# Patient Record
Sex: Female | Born: 1995 | Race: White | Hispanic: No | Marital: Single | Smoking: Never smoker
Health system: Southern US, Community
[De-identification: ages and names within clinical notes are randomized; demographics above are authoritative.]

## PROBLEM LIST (undated history)

## (undated) DIAGNOSIS — F419 Anxiety disorder, unspecified: Secondary | ICD-10-CM

## (undated) DIAGNOSIS — I071 Rheumatic tricuspid insufficiency: Secondary | ICD-10-CM

## (undated) DIAGNOSIS — F1910 Other psychoactive substance abuse, uncomplicated: Secondary | ICD-10-CM

## (undated) DIAGNOSIS — F32A Depression, unspecified: Secondary | ICD-10-CM

## (undated) DIAGNOSIS — F329 Major depressive disorder, single episode, unspecified: Secondary | ICD-10-CM

---
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
## 2016-06-08 ENCOUNTER — Encounter (HOSPITAL_COMMUNITY): Payer: Self-pay | Admitting: Emergency Medicine

## 2016-06-08 ENCOUNTER — Ambulatory Visit (HOSPITAL_COMMUNITY)
Admission: EM | Admit: 2016-06-08 | Discharge: 2016-06-08 | Disposition: A | Discharge status: Home / Self Care | Payer: BLUE CROSS/BLUE SHIELD | Attending: Family Medicine | Admitting: Family Medicine

## 2016-06-08 DIAGNOSIS — J0100 Acute maxillary sinusitis, unspecified: Secondary | ICD-10-CM | POA: Diagnosis not present

## 2016-06-08 HISTORY — DX: Depression, unspecified: F32.A

## 2016-06-08 HISTORY — DX: Anxiety disorder, unspecified: F41.9

## 2016-06-08 HISTORY — DX: Major depressive disorder, single episode, unspecified: F32.9

## 2016-06-08 MED ORDER — FLUTICASONE PROPIONATE 50 MCG/ACT NA SUSP: 2.0000 | Freq: Every day | NASAL | Status: DC

## 2016-06-08 MED ORDER — AMOXICILLIN 500 MG PO CAPS: 500.0000 mg | ORAL_CAPSULE | Freq: Three times a day (TID) | ORAL | Status: DC

## 2016-06-08 NOTE — Discharge Instructions (Signed)
Sinusitis, Adult °Sinusitis is redness, soreness, and puffiness (inflammation) of the air pockets in the bones of your face (sinuses). The redness, soreness, and puffiness can cause air and mucus to get trapped in your sinuses. This can allow germs to grow and cause an infection.  °HOME CARE  °· Drink enough fluids to keep your pee (urine) clear or pale yellow. °· Use a humidifier in your home. °· Run a hot shower to create steam in the bathroom. Sit in the bathroom with the door closed. Breathe in the steam 3-4 times a day. °· Put a warm, moist washcloth on your face 3-4 times a day, or as told by your doctor. °· Use salt water sprays (saline sprays) to wet the thick fluid in your nose. This can help the sinuses drain. °· Only take medicine as told by your doctor. °GET HELP RIGHT AWAY IF:  °· Your pain gets worse. °· You have very bad headaches. °· You are sick to your stomach (nauseous). °· You throw up (vomit). °· You are very sleepy (drowsy) all the time. °· Your face is puffy (swollen). °· Your vision changes. °· You have a stiff neck. °· You have trouble breathing. °MAKE SURE YOU:  °· Understand these instructions. °· Will watch your condition. °· Will get help right away if you are not doing well or get worse. °  °This information is not intended to replace advice given to you by your health care provider. Make sure you discuss any questions you have with your health care provider. °  °Document Released: 06/01/2008 Document Revised: 01/04/2015 Document Reviewed: 07/19/2012 °Elsevier Interactive Patient Education ©2016 [employer] ° °Sinus Rinse °WHAT IS A SINUS RINSE? °A sinus rinse is a home treatment. It rinses your sinuses with a mixture of salt and water (saline solution). Sinuses are air-filled spaces in your skull behind the bones of your face and forehead. They open into your nasal cavity. °To do a sinus rinse, you will need: °· Saline solution. °· Neti pot or spray bottle. This releases the saline  solution into your nose and through your sinuses. You can buy neti pots and spray bottles at: °¨ Your local pharmacy. °¨ A health food store. °¨ Online. °WHEN WOULD I DO A SINUS RINSE?  °A sinus rinse can help to clear your nasal cavity. It can clear:  °· Mucus. °· Dirt. °· Dust. °· Pollen. °You may do a sinus rinse when you have: °· A cold. °· A virus. °· Allergies. °· A sinus infection. °· A stuffy nose. °If you are considering a sinus rinse: °· Ask your child's doctor before doing a sinus rinse on your child. °· Do not do a sinus rinse if you have had: °¨ Ear or nasal surgery. °¨ An ear infection. °¨ Blocked ears. °HOW DO I DO A SINUS RINSE?  °· Wash your hands. °· Disinfect your device using the directions that came with the device. °· Dry your device. °· Use the solution that comes with your device or one that is sold separately in stores. Follow the mixing directions on the package. °· Fill your device with the amount of saline solution as stated in the device instructions. °· Stand over a sink and tilt your head sideways over the sink. °· Place the spout of the device in your upper nostril (the one closer to the ceiling). °· Gently pour or squeeze the saline solution into the nasal cavity. The liquid should drain to the lower nostril if you   are not too congested. °· Gently blow your nose. Blowing too hard may cause ear pain. °· Repeat in the other nostril. °· Clean and rinse your device with clean water. °· Air-dry your device. °ARE THERE RISKS OF A SINUS RINSE?  °Sinus rinse is normally very safe and helpful. However, there are a few risks, which include:  °· A burning feeling in the sinuses. This may happen if you do not make the saline solution as instructed. Make sure to follow all directions when making the saline solution. °· Infection from unclean water. This is rare, but possible. °· Nasal irritation. °  °This information is not intended to replace advice given to you by your health care provider.  Make sure you discuss any questions you have with your health care provider. °  °Document Released: 07/11/2014 Document Reviewed: 07/11/2014 °Elsevier Interactive Patient Education ©2016 Elsevier Inc. ° °

## 2016-06-08 NOTE — ED Provider Notes (Signed)
CSN: [id number]     Arrival date & time 06/08/16  1450 History   First MD Initiated Contact with Patient 06/08/16 1620     Chief Complaint  Patient presents with  . URI   (Consider location/radiation/quality/duration/timing/severity/associated sxs/prior Treatment) HPI History obtained from patient: Location: Upper resp  Context/Duration: Since last Thursday  Severity: 5  Quality:ache, pressure,  Timing:           constant Home Treatment: OTC meds without relief Associated symptoms:  Post nasal drainage.  Family History: HTN, DM-grandparents.    Past Medical History  Diagnosis Date  . Anxiety   . Depression    History reviewed. No pertinent past surgical history. Family History  Problem Relation Age of Onset  . Hypertension Paternal Grandfather   . Asthma Paternal Grandfather   . Diabetes Maternal Grandmother    Social History  Substance Use Topics  . Smoking status: Never Smoker   . Smokeless tobacco: None  . Alcohol Use: No   OB History    No data available     Review of Systems  Denies: HEADACHE, NAUSEA, ABDOMINAL PAIN, CHEST PAIN,   DYSURIA, SHORTNESS OF BREATH  Allergies  Review of patient's allergies indicates no known allergies.  Home Medications   Prior to Admission medications   Medication Sig Start Date End Date Taking? Authorizing Provider  METHADONE HCL PO Take by mouth.   Yes Historical Provider, MD  amoxicillin (AMOXIL) 500 MG capsule Take 1 capsule (500 mg total) by mouth 3 (three) times daily. 06/08/16   Tharon AquasFrank C Maleko Greulich, PA  Aspirin-Acetaminophen [phone] MG PACK Take 1 each by mouth as needed (pain).    Historical Provider, MD  dicyclomine (BENTYL) 20 MG tablet Take 1 tablet (20 mg total) by mouth 2 (two) times daily. 03/29/15   Oswaldo ConroyVictoria Creech, PA-C  fluticasone (FLONASE) 50 MCG/ACT nasal spray Place 2 sprays into both nostrils daily. 06/08/16   Tharon AquasFrank C Fritzi Scripter, PA   Meds Ordered and Administered this Visit  Medications - No data to display  BP  105/56 mmHg  Pulse 113  Temp(Src) 100 F (37.8 C) (Oral)  Resp 22  SpO2 95%  LMP 05/08/2016 No data found.   Physical Exam NURSES NOTES AND VITAL SIGNS REVIEWED. CONSTITUTIONAL: Well developed, well nourished, no acute distress HEENT: normocephalic, atraumatic, maxillary sinus on left, more tender than right.  EYES: Conjunctiva normal NECK:normal ROM, supple, no adenopathy PULMONARY:No respiratory distress, normal effort ABDOMINAL: Soft, ND, NT BS+, No CVAT MUSCULOSKELETAL: Normal ROM of all extremities,  SKIN: warm and dry without rash PSYCHIATRIC: Mood and affect, behavior are normal  ED Course  Procedures (including critical care time)  Labs Review Labs Reviewed - No data to display  Imaging Review No results found.   Visual Acuity Review  Right Eye Distance:   Left Eye Distance:   Bilateral Distance:    Right Eye Near:   Left Eye Near:    Bilateral Near:      RX amoxil, flonase  Expect full recovery.  MDM   1. Acute maxillary sinusitis, recurrence not specified     Patient is reassured that there are no issues that require transfer to higher level of care at this time or additional tests. Patient is advised to continue home symptomatic treatment. Patient is advised that if there are new or worsening symptoms to attend the emergency department, contact primary care provider, or return to UC. Instructions of care provided discharged home in stable condition.    THIS NOTE WAS  GENERATED USING A VOICE RECOGNITION SOFTWARE PROGRAM. ALL REASONABLE EFFORTS  WERE MADE TO PROOFREAD THIS DOCUMENT FOR ACCURACY.  I have verbally reviewed the discharge instructions with the patient. A printed AVS was given to the patient.  All questions were answered prior to discharge.      Tharon Aquas, PA 06/08/16 1646

## 2016-06-08 NOTE — ED Notes (Signed)
Patient reports getting sick on Thursday.  Woke with a headache and has pressure behind ears and nose and eyes.  Started coughing today, productive cough. Denies runny nose.  Patient unsure if running a fever.

## 2022-02-05 ENCOUNTER — Emergency Department (HOSPITAL_BASED_OUTPATIENT_CLINIC_OR_DEPARTMENT_OTHER): Payer: Medicaid Other | Admitting: Radiology

## 2022-02-05 ENCOUNTER — Inpatient Hospital Stay (HOSPITAL_BASED_OUTPATIENT_CLINIC_OR_DEPARTMENT_OTHER)
Admission: EM | Admit: 2022-02-05 | Discharge: 2022-02-09 | DRG: 872 | Disposition: A | Discharge status: Home / Self Care | Payer: Medicaid Other | Attending: Internal Medicine | Admitting: Internal Medicine

## 2022-02-05 ENCOUNTER — Other Ambulatory Visit: Payer: Self-pay

## 2022-02-05 ENCOUNTER — Encounter (HOSPITAL_BASED_OUTPATIENT_CLINIC_OR_DEPARTMENT_OTHER): Payer: Self-pay

## 2022-02-05 DIAGNOSIS — Z20822 Contact with and (suspected) exposure to covid-19: Secondary | ICD-10-CM | POA: Diagnosis present

## 2022-02-05 DIAGNOSIS — B955 Unspecified streptococcus as the cause of diseases classified elsewhere: Secondary | ICD-10-CM | POA: Diagnosis present

## 2022-02-05 DIAGNOSIS — F1990 Other psychoactive substance use, unspecified, uncomplicated: Secondary | ICD-10-CM

## 2022-02-05 DIAGNOSIS — A409 Streptococcal sepsis, unspecified: Principal | ICD-10-CM | POA: Diagnosis present

## 2022-02-05 DIAGNOSIS — A419 Sepsis, unspecified organism: Secondary | ICD-10-CM | POA: Diagnosis present

## 2022-02-05 DIAGNOSIS — Z7982 Long term (current) use of aspirin: Secondary | ICD-10-CM

## 2022-02-05 DIAGNOSIS — L0390 Cellulitis, unspecified: Secondary | ICD-10-CM | POA: Diagnosis present

## 2022-02-05 DIAGNOSIS — R079 Chest pain, unspecified: Secondary | ICD-10-CM | POA: Diagnosis present

## 2022-02-05 DIAGNOSIS — R21 Rash and other nonspecific skin eruption: Secondary | ICD-10-CM | POA: Diagnosis present

## 2022-02-05 DIAGNOSIS — O99320 Drug use complicating pregnancy, unspecified trimester: Secondary | ICD-10-CM | POA: Diagnosis present

## 2022-02-05 DIAGNOSIS — B182 Chronic viral hepatitis C: Secondary | ICD-10-CM | POA: Diagnosis present

## 2022-02-05 DIAGNOSIS — F1910 Other psychoactive substance abuse, uncomplicated: Secondary | ICD-10-CM | POA: Diagnosis present

## 2022-02-05 DIAGNOSIS — L03113 Cellulitis of right upper limb: Secondary | ICD-10-CM | POA: Diagnosis present

## 2022-02-05 DIAGNOSIS — I071 Rheumatic tricuspid insufficiency: Secondary | ICD-10-CM | POA: Diagnosis present

## 2022-02-05 DIAGNOSIS — F1123 Opioid dependence with withdrawal: Secondary | ICD-10-CM | POA: Diagnosis present

## 2022-02-05 DIAGNOSIS — Z79899 Other long term (current) drug therapy: Secondary | ICD-10-CM

## 2022-02-05 DIAGNOSIS — R011 Cardiac murmur, unspecified: Secondary | ICD-10-CM

## 2022-02-05 DIAGNOSIS — R7881 Bacteremia: Secondary | ICD-10-CM | POA: Diagnosis present

## 2022-02-05 DIAGNOSIS — F32A Depression, unspecified: Secondary | ICD-10-CM | POA: Diagnosis present

## 2022-02-05 DIAGNOSIS — F419 Anxiety disorder, unspecified: Secondary | ICD-10-CM | POA: Diagnosis present

## 2022-02-05 DIAGNOSIS — M549 Dorsalgia, unspecified: Secondary | ICD-10-CM | POA: Diagnosis present

## 2022-02-05 HISTORY — DX: Other psychoactive substance abuse, uncomplicated: F19.10

## 2022-02-05 HISTORY — DX: Rheumatic tricuspid insufficiency: I07.1

## 2022-02-05 LAB — COMPREHENSIVE METABOLIC PANEL
ALT: 102 U/L — ABNORMAL HIGH (ref 0–44)
AST: 56 U/L — ABNORMAL HIGH (ref 15–41)
Albumin: 4.2 g/dL (ref 3.5–5.0)
Alkaline Phosphatase: 58 U/L (ref 38–126)
Anion gap: 9 (ref 5–15)
BUN: 12 mg/dL (ref 6–20)
CO2: 29 mmol/L (ref 22–32)
Calcium: 9.2 mg/dL (ref 8.9–10.3)
Chloride: 98 mmol/L (ref 98–111)
Creatinine, Ser: 0.7 mg/dL (ref 0.44–1.00)
GFR, Estimated: >60 mL/min (ref >60)
Glucose, Bld: 108 mg/dL — ABNORMAL HIGH (ref 70–99)
Potassium: 3.7 mmol/L (ref 3.5–5.1)
Sodium: 136 mmol/L (ref 135–145)
Total Bilirubin: 0.6 mg/dL (ref 0.3–1.2)
Total Protein: 7.6 g/dL (ref 6.5–8.1)

## 2022-02-05 LAB — CBC WITH DIFFERENTIAL/PLATELET
Abs Immature Granulocytes: 0.03 10*3/uL (ref 0.00–0.07)
Basophils Absolute: 0 10*3/uL (ref 0.0–0.1)
Basophils Relative: 0 %
Eosinophils Absolute: 0 10*3/uL (ref 0.0–0.5)
Eosinophils Relative: 0 %
HCT: 40.5 % (ref 36.0–46.0)
Hemoglobin: 14.3 g/dL (ref 12.0–15.0)
Immature Granulocytes: 1 %
Lymphocytes Relative: 23 %
Lymphs Abs: 1.4 10*3/uL (ref 0.7–4.0)
MCH: 31 pg (ref 26.0–34.0)
MCHC: 35.3 g/dL (ref 30.0–36.0)
MCV: 87.7 fL (ref 80.0–100.0)
Monocytes Absolute: 0.6 10*3/uL (ref 0.1–1.0)
Monocytes Relative: 9 %
Neutro Abs: 4.2 10*3/uL (ref 1.7–7.7)
Neutrophils Relative %: 67 %
Platelets: 149 10*3/uL — ABNORMAL LOW (ref 150–400)
RBC: 4.62 MIL/uL (ref 3.87–5.11)
RDW: 12 % (ref 11.5–15.5)
WBC: 6.2 10*3/uL (ref 4.0–10.5)
nRBC: 0 % (ref 0.0–0.2)

## 2022-02-05 LAB — PROTIME-INR
INR: 1.1 (ref 0.8–1.2)
Prothrombin Time: 14.7 seconds (ref 11.4–15.2)

## 2022-02-05 LAB — APTT: aPTT: 32 seconds (ref 24–36)

## 2022-02-05 MED ORDER — LACTATED RINGERS IV BOLUS (SEPSIS)
250.0000 mL | Freq: Once | INTRAVENOUS | Status: AC
  Administered 2022-02-06: 250 mL via INTRAVENOUS

## 2022-02-05 MED ORDER — METRONIDAZOLE 500 MG/100ML IV SOLN
500.0000 mg | Freq: Once | INTRAVENOUS | Status: AC
  Administered 2022-02-05: 500 mg via INTRAVENOUS
  Filled 2022-02-05: qty 100

## 2022-02-05 MED ORDER — LACTATED RINGERS IV BOLUS (SEPSIS)
1000.0000 mL | Freq: Once | INTRAVENOUS | Status: AC
  Administered 2022-02-05: 1000 mL via INTRAVENOUS

## 2022-02-05 MED ORDER — LACTATED RINGERS IV SOLN: INTRAVENOUS | Status: DC

## 2022-02-05 MED ORDER — SODIUM CHLORIDE 0.9 % IV SOLN
2.0000 g | Freq: Once | INTRAVENOUS | Status: AC
  Administered 2022-02-05: 2 g via INTRAVENOUS
  Filled 2022-02-05: qty 2

## 2022-02-05 MED ORDER — ACETAMINOPHEN 500 MG PO TABS
1000.0000 mg | ORAL_TABLET | Freq: Once | ORAL | Status: AC
  Administered 2022-02-05: 1000 mg via ORAL
  Filled 2022-02-05: qty 2

## 2022-02-05 MED ORDER — VANCOMYCIN HCL IN DEXTROSE 1-5 GM/200ML-% IV SOLN
1000.0000 mg | Freq: Once | INTRAVENOUS | Status: AC
  Administered 2022-02-05: 1000 mg via INTRAVENOUS
  Filled 2022-02-05: qty 200

## 2022-02-05 MED ORDER — LACTATED RINGERS IV BOLUS (SEPSIS)
500.0000 mL | Freq: Once | INTRAVENOUS | Status: AC
  Administered 2022-02-06: 500 mL via INTRAVENOUS

## 2022-02-05 NOTE — ED Triage Notes (Signed)
Pt arrives POV with c/o central chest pain, back pain, leg pain that has been getting worse.  Pt is an active heroin and meth user.  Used heroin last yesterday.  Right hand and forearm are red and swollen, pt states she missed when shooting up a couple of days ago.  Has history of sepsis.

## 2022-02-05 NOTE — Sepsis Progress Note (Signed)
Monitoring for the code sepsis protocol. °

## 2022-02-06 ENCOUNTER — Encounter (HOSPITAL_BASED_OUTPATIENT_CLINIC_OR_DEPARTMENT_OTHER): Payer: Self-pay | Admitting: Emergency Medicine

## 2022-02-06 DIAGNOSIS — Z20822 Contact with and (suspected) exposure to covid-19: Secondary | ICD-10-CM | POA: Diagnosis present

## 2022-02-06 DIAGNOSIS — A409 Streptococcal sepsis, unspecified: Secondary | ICD-10-CM | POA: Diagnosis present

## 2022-02-06 DIAGNOSIS — L0390 Cellulitis, unspecified: Secondary | ICD-10-CM | POA: Diagnosis not present

## 2022-02-06 DIAGNOSIS — A419 Sepsis, unspecified organism: Secondary | ICD-10-CM | POA: Diagnosis present

## 2022-02-06 DIAGNOSIS — Z79899 Other long term (current) drug therapy: Secondary | ICD-10-CM | POA: Diagnosis not present

## 2022-02-06 DIAGNOSIS — F1910 Other psychoactive substance abuse, uncomplicated: Secondary | ICD-10-CM | POA: Diagnosis present

## 2022-02-06 DIAGNOSIS — O99320 Drug use complicating pregnancy, unspecified trimester: Secondary | ICD-10-CM | POA: Diagnosis not present

## 2022-02-06 DIAGNOSIS — R21 Rash and other nonspecific skin eruption: Secondary | ICD-10-CM | POA: Diagnosis present

## 2022-02-06 DIAGNOSIS — F32A Depression, unspecified: Secondary | ICD-10-CM | POA: Diagnosis present

## 2022-02-06 DIAGNOSIS — M549 Dorsalgia, unspecified: Secondary | ICD-10-CM | POA: Diagnosis present

## 2022-02-06 DIAGNOSIS — Z7982 Long term (current) use of aspirin: Secondary | ICD-10-CM | POA: Diagnosis not present

## 2022-02-06 DIAGNOSIS — B182 Chronic viral hepatitis C: Secondary | ICD-10-CM | POA: Diagnosis present

## 2022-02-06 DIAGNOSIS — R7881 Bacteremia: Secondary | ICD-10-CM | POA: Diagnosis not present

## 2022-02-06 DIAGNOSIS — R079 Chest pain, unspecified: Secondary | ICD-10-CM | POA: Diagnosis present

## 2022-02-06 DIAGNOSIS — F419 Anxiety disorder, unspecified: Secondary | ICD-10-CM | POA: Diagnosis present

## 2022-02-06 DIAGNOSIS — F1990 Other psychoactive substance use, unspecified, uncomplicated: Secondary | ICD-10-CM | POA: Diagnosis not present

## 2022-02-06 DIAGNOSIS — I071 Rheumatic tricuspid insufficiency: Secondary | ICD-10-CM | POA: Diagnosis present

## 2022-02-06 DIAGNOSIS — B955 Unspecified streptococcus as the cause of diseases classified elsewhere: Secondary | ICD-10-CM | POA: Diagnosis not present

## 2022-02-06 DIAGNOSIS — L03113 Cellulitis of right upper limb: Secondary | ICD-10-CM | POA: Diagnosis present

## 2022-02-06 DIAGNOSIS — F1123 Opioid dependence with withdrawal: Secondary | ICD-10-CM | POA: Diagnosis present

## 2022-02-06 DIAGNOSIS — R011 Cardiac murmur, unspecified: Secondary | ICD-10-CM | POA: Diagnosis present

## 2022-02-06 LAB — BLOOD CULTURE ID PANEL (REFLEXED) - BCID2
A.calcoaceticus-baumannii: NOT DETECTED
A.calcoaceticus-baumannii: NOT DETECTED
Bacteroides fragilis: NOT DETECTED
Bacteroides fragilis: NOT DETECTED
Candida albicans: NOT DETECTED
Candida albicans: NOT DETECTED
Candida auris: NOT DETECTED
Candida auris: NOT DETECTED
Candida glabrata: NOT DETECTED
Candida glabrata: NOT DETECTED
Candida krusei: NOT DETECTED
Candida krusei: NOT DETECTED
Candida parapsilosis: NOT DETECTED
Candida parapsilosis: NOT DETECTED
Candida tropicalis: NOT DETECTED
Candida tropicalis: NOT DETECTED
Cryptococcus neoformans/gattii: NOT DETECTED
Cryptococcus neoformans/gattii: NOT DETECTED
Enterobacter cloacae complex: NOT DETECTED
Enterobacter cloacae complex: NOT DETECTED
Enterobacterales: NOT DETECTED
Enterobacterales: NOT DETECTED
Enterococcus Faecium: NOT DETECTED
Enterococcus Faecium: NOT DETECTED
Enterococcus faecalis: NOT DETECTED
Enterococcus faecalis: NOT DETECTED
Escherichia coli: NOT DETECTED
Escherichia coli: NOT DETECTED
Haemophilus influenzae: NOT DETECTED
Haemophilus influenzae: NOT DETECTED
Klebsiella aerogenes: NOT DETECTED
Klebsiella aerogenes: NOT DETECTED
Klebsiella oxytoca: NOT DETECTED
Klebsiella oxytoca: NOT DETECTED
Klebsiella pneumoniae: NOT DETECTED
Klebsiella pneumoniae: NOT DETECTED
Listeria monocytogenes: NOT DETECTED
Listeria monocytogenes: NOT DETECTED
Neisseria meningitidis: NOT DETECTED
Neisseria meningitidis: NOT DETECTED
Proteus species: NOT DETECTED
Proteus species: NOT DETECTED
Pseudomonas aeruginosa: NOT DETECTED
Pseudomonas aeruginosa: NOT DETECTED
Salmonella species: NOT DETECTED
Salmonella species: NOT DETECTED
Serratia marcescens: NOT DETECTED
Serratia marcescens: NOT DETECTED
Staphylococcus aureus (BCID): NOT DETECTED
Staphylococcus aureus (BCID): NOT DETECTED
Staphylococcus epidermidis: NOT DETECTED
Staphylococcus epidermidis: NOT DETECTED
Staphylococcus lugdunensis: NOT DETECTED
Staphylococcus lugdunensis: NOT DETECTED
Staphylococcus species: NOT DETECTED
Staphylococcus species: NOT DETECTED
Stenotrophomonas maltophilia: NOT DETECTED
Stenotrophomonas maltophilia: NOT DETECTED
Streptococcus agalactiae: NOT DETECTED
Streptococcus agalactiae: NOT DETECTED
Streptococcus pneumoniae: NOT DETECTED
Streptococcus pneumoniae: NOT DETECTED
Streptococcus pyogenes: DETECTED — AB
Streptococcus pyogenes: DETECTED — AB
Streptococcus species: DETECTED — AB
Streptococcus species: DETECTED — AB

## 2022-02-06 LAB — URINALYSIS, ROUTINE W REFLEX MICROSCOPIC
Bilirubin Urine: NEGATIVE
Glucose, UA: NEGATIVE mg/dL
Hgb urine dipstick: NEGATIVE
Ketones, ur: 15 mg/dL — AB
Nitrite: NEGATIVE
Protein, ur: 30 mg/dL — AB
Specific Gravity, Urine: 1.033 — ABNORMAL HIGH (ref 1.005–1.030)
pH: 6.5 (ref 5.0–8.0)

## 2022-02-06 LAB — PREGNANCY, URINE: Preg Test, Ur: NEGATIVE

## 2022-02-06 LAB — RAPID URINE DRUG SCREEN, HOSP PERFORMED
Amphetamines: POSITIVE — AB
Barbiturates: NOT DETECTED
Benzodiazepines: NOT DETECTED
Cocaine: NOT DETECTED
Opiates: NOT DETECTED
Tetrahydrocannabinol: NOT DETECTED

## 2022-02-06 LAB — RESP PANEL BY RT-PCR (FLU A&B, COVID) ARPGX2
Influenza A by PCR: NEGATIVE
Influenza B by PCR: NEGATIVE
SARS Coronavirus 2 by RT PCR: NEGATIVE

## 2022-02-06 LAB — LACTIC ACID, PLASMA
Lactic Acid, Venous: 2.6 mmol/L (ref 0.5–1.9)
Lactic Acid, Venous: 1.1 mmol/L (ref 0.5–1.9)

## 2022-02-06 MED ORDER — SODIUM CHLORIDE 0.9 % IV SOLN
2.0000 g | Freq: Three times a day (TID) | INTRAVENOUS | Status: DC
  Administered 2022-02-06 (×2): 2 g via INTRAVENOUS
  Filled 2022-02-06 (×4): qty 2

## 2022-02-06 MED ORDER — VANCOMYCIN HCL 500 MG/100ML IV SOLN
500.0000 mg | Freq: Three times a day (TID) | INTRAVENOUS | Status: DC
  Filled 2022-02-06 (×3): qty 100

## 2022-02-06 MED ORDER — SODIUM CHLORIDE 0.9% FLUSH
3.0000 mL | Freq: Two times a day (BID) | INTRAVENOUS | Status: DC
  Administered 2022-02-06 – 2022-02-07 (×3): 3 mL via INTRAVENOUS

## 2022-02-06 MED ORDER — ONDANSETRON HCL 4 MG/2ML IJ SOLN
4.0000 mg | Freq: Once | INTRAMUSCULAR | Status: AC
Start: 2022-02-06 — End: 2022-02-06

## 2022-02-06 MED ORDER — PENICILLIN G POTASSIUM 20000000 UNITS IJ SOLR
12.0000 10*6.[IU] | Freq: Two times a day (BID) | INTRAVENOUS | Status: DC
  Administered 2022-02-06 – 2022-02-09 (×6): 12 10*6.[IU] via INTRAVENOUS
  Filled 2022-02-06 (×8): qty 12

## 2022-02-06 MED ORDER — VANCOMYCIN HCL 750 MG/150ML IV SOLN
750.0000 mg | Freq: Two times a day (BID) | INTRAVENOUS | Status: DC
  Filled 2022-02-06: qty 150

## 2022-02-06 MED ORDER — VANCOMYCIN HCL 500 MG IV SOLR
500.0000 mg | Freq: Once | INTRAVENOUS | Status: AC
  Administered 2022-02-06: 500 mg via INTRAVENOUS
  Filled 2022-02-06: qty 10

## 2022-02-06 MED ORDER — ONDANSETRON HCL 4 MG/2ML IJ SOLN
INTRAMUSCULAR | Status: AC
  Administered 2022-02-06: 4 mg via INTRAVENOUS
  Filled 2022-02-06: qty 2

## 2022-02-06 MED ORDER — SODIUM CHLORIDE 0.9 % IV SOLN: 2.0000 g | INTRAVENOUS | Status: DC

## 2022-02-06 MED ORDER — SODIUM CHLORIDE 0.9% FLUSH: 3.0000 mL | INTRAVENOUS | Status: DC | PRN

## 2022-02-06 MED ORDER — KETOROLAC TROMETHAMINE 30 MG/ML IJ SOLN
15.0000 mg | Freq: Once | INTRAMUSCULAR | Status: AC
  Administered 2022-02-06: 15 mg via INTRAVENOUS
  Filled 2022-02-06: qty 1

## 2022-02-06 MED ORDER — ACETAMINOPHEN 500 MG PO TABS
1000.0000 mg | ORAL_TABLET | Freq: Once | ORAL | Status: AC
  Administered 2022-02-06: 1000 mg via ORAL
  Filled 2022-02-06: qty 2

## 2022-02-06 MED ORDER — SODIUM CHLORIDE 0.9 % IV SOLN: 250.0000 mL | INTRAVENOUS | Status: DC | PRN

## 2022-02-06 MED ORDER — LACTATED RINGERS IV BOLUS
500.0000 mL | Freq: Once | INTRAVENOUS | Status: AC
Start: 2022-02-06 — End: 2022-02-06
  Administered 2022-02-06: 500 mL via INTRAVENOUS

## 2022-02-06 MED ORDER — VANCOMYCIN HCL 500 MG IV SOLR
INTRAVENOUS | Status: AC
  Filled 2022-02-06: qty 10

## 2022-02-06 MED ORDER — SODIUM CHLORIDE 0.9 % IV BOLUS
500.0000 mL | Freq: Once | INTRAVENOUS | Status: AC
Start: 2022-02-06 — End: 2022-02-06
  Administered 2022-02-06: 500 mL via INTRAVENOUS

## 2022-02-06 NOTE — Progress Notes (Signed)
PHARMACY - PHYSICIAN COMMUNICATION CRITICAL VALUE ALERT - BLOOD CULTURE IDENTIFICATION (BCID)  Belinda Wilson is an 26 y.o. female who presented to [facility] on 02/05/2022 with a chief complaint of RUE cellulitis. Known Hx IVDU.  Assessment: 1/4 BCx bottles growing GAS (include suspected source if known)  Name of physician (or Provider) Contacted: [place]  Current antibiotics: Vanc, cefepime  Changes to prescribed antibiotics recommended: narrowed to PCN continuous infusion per BCID protocol Recommendations accepted by provider  Results for orders placed or performed during the hospital encounter of 02/05/22  Blood Culture ID Panel (Reflexed) (Collected: 02/05/2022 11:20 PM)  Result Value Ref Range   Enterococcus faecalis NOT DETECTED NOT DETECTED   Enterococcus Faecium NOT DETECTED NOT DETECTED   Listeria monocytogenes NOT DETECTED NOT DETECTED   Staphylococcus species NOT DETECTED NOT DETECTED   Staphylococcus aureus (BCID) NOT DETECTED NOT DETECTED   Staphylococcus epidermidis NOT DETECTED NOT DETECTED   Staphylococcus lugdunensis NOT DETECTED NOT DETECTED   Streptococcus species DETECTED (A) NOT DETECTED   Streptococcus agalactiae NOT DETECTED NOT DETECTED   Streptococcus pneumoniae NOT DETECTED NOT DETECTED   Streptococcus pyogenes DETECTED (A) NOT DETECTED   A.calcoaceticus-baumannii NOT DETECTED NOT DETECTED   Bacteroides fragilis NOT DETECTED NOT DETECTED   Enterobacterales NOT DETECTED NOT DETECTED   Enterobacter cloacae complex NOT DETECTED NOT DETECTED   Escherichia coli NOT DETECTED NOT DETECTED   Klebsiella aerogenes NOT DETECTED NOT DETECTED   Klebsiella oxytoca NOT DETECTED NOT DETECTED   Klebsiella pneumoniae NOT DETECTED NOT DETECTED   Proteus species NOT DETECTED NOT DETECTED   Salmonella species NOT DETECTED NOT DETECTED   Serratia marcescens NOT DETECTED NOT DETECTED   Haemophilus influenzae NOT DETECTED NOT DETECTED   Neisseria meningitidis NOT DETECTED NOT  DETECTED   Pseudomonas aeruginosa NOT DETECTED NOT DETECTED   Stenotrophomonas maltophilia NOT DETECTED NOT DETECTED   Candida albicans NOT DETECTED NOT DETECTED   Candida auris NOT DETECTED NOT DETECTED   Candida glabrata NOT DETECTED NOT DETECTED   Candida krusei NOT DETECTED NOT DETECTED   Candida parapsilosis NOT DETECTED NOT DETECTED   Candida tropicalis NOT DETECTED NOT DETECTED   Cryptococcus neoformans/gattii NOT DETECTED NOT DETECTED    Donavon Kimrey A 02/06/2022  8:07 PM

## 2022-02-06 NOTE — Progress Notes (Signed)
Pharmacy Antibiotic Note  Belinda Wilson is a 26 y.o. female admitted on 02/05/2022 with sepsis. Pharmacy has been consulted for Vancomycin/Cefepime dosing. Pt has right hand and forearm swelling at site of IV drug use. WBC WNL. Renal function good.   Plan: Change vancomycin to 500mg  IV Q8H based on drug availability at [facility] F/u renal fxn, C&S, clinical status and peak/trough at [facility]   Height: 5\' 4"  (162.6 cm) Weight: 52.2 kg (115 lb) IBW/kg (Calculated) : 54.7  Temp (24hrs), Avg:101.1 F (38.4 C), Min:99.5 F (37.5 C), Max:102.2 F (39 C)  Recent Labs  Lab 02/05/22 2253 02/06/22 0134  WBC 6.2  --   CREATININE 0.70  --   LATICACIDVEN 2.6* 1.1     Estimated Creatinine Clearance: 89.4 mL/min (by C-G formula based on SCr of 0.7 mg/dL).    No Known Allergies  2254, PharmD, BCPS Clinical Pharmacist Please see AMION for all pharmacy numbers 02/06/2022 10:33 AM

## 2022-02-06 NOTE — ED Provider Notes (Signed)
[facility] EMERGENCY DEPT Provider Note   CSN: [id number] Arrival date & time: 02/05/22  2200     History  Chief Complaint  Patient presents with   Chest Pain    Belinda Wilson is a 26 y.o. female.  The history is provided by the patient.  Chest Pain Pain location:  Substernal area Pain quality: sharp   Pain radiates to:  Does not radiate Pain severity:  Moderate Onset quality:  Gradual Duration: days. Timing:  Constant Progression:  Unchanged Chronicity:  Recurrent Context: at rest   Relieved by:  Nothing Worsened by:  Nothing Ineffective treatments:  None tried Associated symptoms: back pain and fever   Associated symptoms: no cough, no palpitations, no shortness of breath and no vomiting   Associated symptoms comment:  B arm pain secondary to cellulitis from injections of heroin R>>>L Risk factors: no birth control, no coronary artery disease, not female and not obese       Home Medications Prior to Admission medications   Medication Sig Start Date End Date Taking? Authorizing Provider  amoxicillin (AMOXIL) 500 MG capsule Take 1 capsule (500 mg total) by mouth 3 (three) times daily. 06/08/16   Tharon Aquas, PA  Aspirin-Acetaminophen [phone] MG PACK Take 1 each by mouth as needed (pain).    [provider]  dicyclomine (BENTYL) 20 MG tablet Take 1 tablet (20 mg total) by mouth 2 (two) times daily. 03/29/15   Oswaldo Conroy, PA-C  fluticasone (FLONASE) 50 MCG/ACT nasal spray Place 2 sprays into both nostrils daily. 06/08/16   Tharon Aquas, PA  METHADONE HCL PO Take by mouth.    [provider]      Allergies    Patient has no known allergies.    Review of Systems   Review of Systems  Constitutional:  Positive for fever.  HENT:  Negative for congestion and drooling.   Eyes:  Negative for redness.  Respiratory:  Negative for cough and shortness of breath.   Cardiovascular:  Positive for chest pain. Negative for  palpitations.  Gastrointestinal:  Negative for diarrhea and vomiting.  Genitourinary:  Negative for difficulty urinating.  Musculoskeletal:  Positive for back pain.  Skin:  Positive for color change.  Neurological:  Negative for facial asymmetry.  Psychiatric/Behavioral:  Negative for agitation.   All other systems reviewed and are negative.  Physical Exam Updated Vital Signs BP (!) 94/54    Pulse 97    Temp (!) 101 F (38.3 C) (Oral)    Resp (!) 22    Ht 5\' 4"  (1.626 m)    Wt 52.2 kg    SpO2 96%    BMI 19.74 kg/m  Physical Exam Vitals and nursing note reviewed. Exam conducted with a chaperone present.  Constitutional:      Appearance: Normal appearance. She is ill-appearing.  HENT:     Head: Normocephalic and atraumatic.     Nose: Nose normal.  Eyes:     Conjunctiva/sclera: Conjunctivae normal.     Pupils: Pupils are equal, round, and reactive to light.  Cardiovascular:     Rate and Rhythm: Regular rhythm. Tachycardia present.     Pulses: Normal pulses.     Heart sounds: Murmur heard.  Pulmonary:     Effort: Pulmonary effort is normal. No respiratory distress.     Breath sounds: Normal breath sounds. No wheezing or rales.  Abdominal:     General: Bowel sounds are normal.     Palpations: Abdomen is soft.  Tenderness: There is no abdominal tenderness. There is no guarding.  Musculoskeletal:     Cervical back: Normal range of motion and neck supple.  Lymphadenopathy:     Cervical: No cervical adenopathy.  Skin:    General: Skin is warm and dry.     Capillary Refill: Capillary refill takes less than 2 seconds.     Findings: Erythema present.       Neurological:     General: No focal deficit present.     Mental Status: She is alert and oriented to person, place, and time.     Deep Tendon Reflexes: Reflexes normal.  Psychiatric:        Mood and Affect: Mood normal.        Behavior: Behavior normal.    ED Results / Procedures / Treatments   Labs (all labs ordered  are listed, but only abnormal results are displayed) Results for orders placed or performed during the hospital encounter of 02/05/22  Resp Panel by RT-PCR (Flu A&B, Covid) Nasopharyngeal Swab   Specimen: Nasopharyngeal Swab; Nasopharyngeal(NP) swabs in vial transport medium  Result Value Ref Range   SARS Coronavirus 2 by RT PCR NEGATIVE NEGATIVE   Influenza A by PCR NEGATIVE NEGATIVE   Influenza B by PCR NEGATIVE NEGATIVE  Comprehensive metabolic panel  Result Value Ref Range   Sodium 136 135 - 145 mmol/L   Potassium 3.7 3.5 - 5.1 mmol/L   Chloride 98 98 - 111 mmol/L   CO2 29 22 - 32 mmol/L   Glucose, Bld 108 (H) 70 - 99 mg/dL   BUN 12 6 - 20 mg/dL   Creatinine, Ser 4.090.70 0.44 - 1.00 mg/dL   Calcium 9.2 8.9 - 81.110.3 mg/dL   Total Protein 7.6 6.5 - 8.1 g/dL   Albumin 4.2 3.5 - 5.0 g/dL   AST 56 (H) 15 - 41 U/L   ALT 102 (H) 0 - 44 U/L   Alkaline Phosphatase 58 38 - 126 U/L   Total Bilirubin 0.6 0.3 - 1.2 mg/dL   GFR, Estimated >91>60 >47>60 mL/min   Anion gap 9 5 - 15  Lactic acid, plasma  Result Value Ref Range   Lactic Acid, Venous 2.6 (HH) 0.5 - 1.9 mmol/L  Lactic acid, plasma  Result Value Ref Range   Lactic Acid, Venous 1.1 0.5 - 1.9 mmol/L  CBC with Differential  Result Value Ref Range   WBC 6.2 4.0 - 10.5 K/uL   RBC 4.62 3.87 - 5.11 MIL/uL   Hemoglobin 14.3 12.0 - 15.0 g/dL   HCT 82.940.5 56.236.0 - 13.046.0 %   MCV 87.7 80.0 - 100.0 fL   MCH 31.0 26.0 - 34.0 pg   MCHC 35.3 30.0 - 36.0 g/dL   RDW 86.512.0 78.411.5 - 69.615.5 %   Platelets 149 (L) 150 - 400 K/uL   nRBC 0.0 0.0 - 0.2 %   Neutrophils Relative % 67 %   Neutro Abs 4.2 1.7 - 7.7 K/uL   Lymphocytes Relative 23 %   Lymphs Abs 1.4 0.7 - 4.0 K/uL   Monocytes Relative 9 %   Monocytes Absolute 0.6 0.1 - 1.0 K/uL   Eosinophils Relative 0 %   Eosinophils Absolute 0.0 0.0 - 0.5 K/uL   Basophils Relative 0 %   Basophils Absolute 0.0 0.0 - 0.1 K/uL   Immature Granulocytes 1 %   Abs Immature Granulocytes 0.03 0.00 - 0.07 K/uL   Protime-INR  Result Value Ref Range   Prothrombin Time 14.7 11.4 - 15.2  seconds   INR 1.1 0.8 - 1.2  APTT  Result Value Ref Range   aPTT 32 24 - 36 seconds   DG Chest 2 View  Result Date: 02/05/2022 CLINICAL DATA:  Suspected sepsis EXAM: CHEST - 2 VIEW COMPARISON:  Report 03/25/2014 FINDINGS: The heart size and mediastinal contours are within normal limits. Both lungs are clear. The visualized skeletal structures are unremarkable. IMPRESSION: No active cardiopulmonary disease. Electronically Signed   By: Jasmine Pang M.D.   On: 02/05/2022 23:49   DG Forearm Right  Result Date: 02/05/2022 CLINICAL DATA:  Infection red and swollen EXAM: RIGHT FOREARM - 2 VIEW COMPARISON:  None. FINDINGS: No fracture or malalignment. No periostitis or frank osseous destructive change. No radiopaque foreign body. No significant elbow effusion. Generalized edema within the soft tissues of the forearm. No soft tissue gas IMPRESSION: Soft tissue edema.  No acute osseous abnormality. Electronically Signed   By: Jasmine Pang M.D.   On: 02/05/2022 23:50    EKG None  Radiology DG Chest 2 View  Result Date: 02/05/2022 CLINICAL DATA:  Suspected sepsis EXAM: CHEST - 2 VIEW COMPARISON:  Report 03/25/2014 FINDINGS: The heart size and mediastinal contours are within normal limits. Both lungs are clear. The visualized skeletal structures are unremarkable. IMPRESSION: No active cardiopulmonary disease. Electronically Signed   By: Jasmine Pang M.D.   On: 02/05/2022 23:49   DG Forearm Right  Result Date: 02/05/2022 CLINICAL DATA:  Infection red and swollen EXAM: RIGHT FOREARM - 2 VIEW COMPARISON:  None. FINDINGS: No fracture or malalignment. No periostitis or frank osseous destructive change. No radiopaque foreign body. No significant elbow effusion. Generalized edema within the soft tissues of the forearm. No soft tissue gas IMPRESSION: Soft tissue edema.  No acute osseous abnormality. Electronically Signed   By: Jasmine Pang M.D.   On: 02/05/2022 23:50    Procedures Procedures    Medications Ordered in ED Medications  lactated ringers infusion ( Intravenous New Bag/Given 02/06/22 0129)  vancomycin (VANCOREADY) IVPB 750 mg/150 mL (has no administration in time range)  ceFEPIme (MAXIPIME) 2 g in sodium chloride 0.9 % 100 mL IVPB (has no administration in time range)  lactated ringers bolus 500 mL (has no administration in time range)  lactated ringers bolus 1,000 mL (0 mLs Intravenous Stopped 02/06/22 0038)    And  lactated ringers bolus 500 mL (0 mLs Intravenous Stopped 02/06/22 0124)    And  lactated ringers bolus 250 mL (0 mLs Intravenous Stopped 02/06/22 0124)  ceFEPIme (MAXIPIME) 2 g in sodium chloride 0.9 % 100 mL IVPB (0 g Intravenous Stopped 02/06/22 0038)  metroNIDAZOLE (FLAGYL) IVPB 500 mg (0 mg Intravenous Stopped 02/06/22 0038)  vancomycin (VANCOCIN) IVPB 1000 mg/200 mL premix (0 mg Intravenous Stopped 02/06/22 0038)  acetaminophen (TYLENOL) tablet 1,000 mg (1,000 mg Oral Given 02/05/22 2324)  sodium chloride 0.9 % bolus 500 mL (0 mLs Intravenous Stopped 02/06/22 0220)  ketorolac (TORADOL) 30 MG/ML injection 15 mg (15 mg Intravenous Given 02/06/22 0142)    ED Course/ Medical Decision Making/ A&P                           Medical Decision Making Amount and/or Complexity of Data Reviewed Labs: ordered.    Details: normal white count, platelet count is normal, elevated lactate Radiology: ordered.    Details: Normal CXR no FB inst the soft tissue No gas in soft tissues ECG/medicine tests: ordered and independent interpretation performed.  Decision-making details documented in ED Course.  Risk OTC drugs. Prescription drug management. Decision regarding hospitalization. Risk Details: Patient with IVDU and arm cellulitis and chest pain with murmur, differential is cellulitis with sepsis vs endocarditis.  I favor endocarditis with pain and murmur and h/o of injection.  Cultures sent broad spectrum  antibiotics ordered and IV boluses from sepsis bundle   Critical Care Total time providing critical care: 75-105 minutes   EKG Interpretation  Date/Time:  Thursday February 05 2022 22:17:31 EST Ventricular Rate:  109 PR Interval:  120 QRS Duration: 78 QT Interval:  294 QTC Calculation: 395 R Axis:   93 Text Interpretation: Sinus tachycardia Rightward axis ST T changes Confirmed by Nicanor Alcon, Niah Heinle ([id number]) on 02/06/2022 4:56:10 AM        Final Clinical Impression(s) / ED Diagnoses Final diagnoses:  Sepsis, due to unspecified organism, unspecified whether acute organ dysfunction present [facility])  IVDU (intravenous drug user)  Cellulitis, unspecified cellulitis site  Heart murmur   The patient appears reasonably stabilized for admission considering the current resources, flow, and capabilities available in the ED at this time, and I doubt any other [facility] requiring further screening and/or treatment in the ED prior to admission.  Rx / DC Orders ED Discharge Orders     None         Anber Mckiver, MD 02/06/22 7564

## 2022-02-06 NOTE — Progress Notes (Signed)
Pharmacy Antibiotic Note  Belinda Wilson is a 26 y.o. female admitted on 02/05/2022 with sepsis.  Pharmacy has been consulted for Vancomycin/Cefepime dosing. Pt has right hand and forearm swelling at site of IV drug use. WBC WNL. Renal function good.   Plan: Vancomycin 750 mg IV q12h >>>Estimated AUC: 430 Cefepime 2g IV q8h Trend WBC, temp, renal function  F/U infectious work-up Drug levels as indicated   Height: 5\' 4"  (162.6 cm) Weight: 52.2 kg (115 lb) IBW/kg (Calculated) : 54.7  Temp (24hrs), Avg:102.2 F (39 C), Min:102.2 F (39 C), Max:102.2 F (39 C)  Recent Labs  Lab 02/05/22 2253  WBC 6.2  CREATININE 0.70  LATICACIDVEN 2.6*    Estimated Creatinine Clearance: 89.4 mL/min (by C-G formula based on SCr of 0.7 mg/dL).    No Known Allergies  Narda Bonds, PharmD, BCPS Clinical Pharmacist Phone: [phone]

## 2022-02-06 NOTE — H&P (Signed)
History and Physical    Belinda Wilson FBP:[id number] DOB: 1996-11-13 DOA: 02/05/2022  PCP: Patient, No Pcp Per (Inactive)  Patient coming from: Home, [facility]  Chief Complaint: Rash swelling and redness to right upper extremity  HPI: Belinda Wilson is a 26 y.o. female with medical history significant of IV drug abuse heroin comes in with less than 48 hours of redness and swelling to her right upper extremity wrist area.  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  She has had several periods where she has been sober throughout her life but has been actively using for the last year daily usage of heroin.  She denies any nausea or vomiting or diarrhea.  She presented to [facility] urgent care was given Vanco cefepime and Flagyl.  She reports that the swelling has markedly improved along with the cellulitis approximately 30 to 40% improved already since she got her antibiotics last night.  Patient transferred here for further work-up and evaluation.  Review of Systems: As per HPI otherwise 10 point review of systems negative.   Past Medical History:  Diagnosis Date   Anxiety    Depression    IV drug abuse (HCC)    Tricuspid regurgitation     History reviewed. No pertinent surgical history.   reports that she has never smoked. She does not have any smokeless tobacco history on file. She reports current drug use. Drugs: Marijuana and IV. She reports that she does not drink alcohol.  No Known Allergies  Family History  Problem Relation Age of Onset   Hypertension Paternal Grandfather    Asthma Paternal Grandfather    Diabetes Maternal Grandmother     Prior to Admission medications   Medication Sig Start Date End Date Taking? Authorizing Provider  aspirin EC 81 MG tablet Take 81 mg by mouth daily. Swallow whole.   Yes [provider]  amoxicillin (AMOXIL) 500 MG capsule Take 1 capsule (500 mg total) by mouth 3 (three) times daily. Patient  not taking: Reported on 02/06/2022 06/08/16   Tharon Aquas, PA  dicyclomine (BENTYL) 20 MG tablet Take 1 tablet (20 mg total) by mouth 2 (two) times daily. Patient not taking: Reported on 02/06/2022 03/29/15   Oswaldo Conroy, PA-C  fluticasone [facility]) 50 MCG/ACT nasal spray Place 2 sprays into both nostrils daily. Patient not taking: Reported on 02/06/2022 06/08/16   Tharon Aquas, [place]    Physical Exam: Vitals:   02/06/22 1300 02/06/22 1320 02/06/22 1507 02/06/22 1517  BP: 99/64 97/75 113/71   Pulse: 81 90 (!) 105   Resp: 15  14   Temp:  98.4 F (36.9 C) 98.8 F (37.1 C)   TempSrc:  Oral Oral   SpO2: 94% 100% 100%   Weight:    64.3 kg  Height:          Constitutional: NAD, calm, comfortable Vitals:   02/06/22 1300 02/06/22 1320 02/06/22 1507 02/06/22 1517  BP: 99/64 97/75 113/71   Pulse: 81 90 (!) 105   Resp: 15  14   Temp:  98.4 F (36.9 C) 98.8 F (37.1 C)   TempSrc:  Oral Oral   SpO2: 94% 100% 100%   Weight:    64.3 kg  Height:       Eyes: PERRL, lids and conjunctivae normal ENMT: Mucous membranes are moist. Posterior pharynx clear of any exudate or lesions.Normal dentition.  Neck: normal, supple, no masses, no thyromegaly Respiratory: clear to auscultation bilaterally,  no wheezing, no crackles. Normal respiratory effort. No accessory muscle use.  Cardiovascular: Regular rate and rhythm, no murmurs / rubs / gallops. No extremity edema. 2+ pedal pulses. No carotid bruits.  Abdomen: no tenderness, no masses palpated. No hepatosplenomegaly. Bowel sounds positive.  Musculoskeletal: no clubbing / cyanosis. No joint deformity upper and lower extremities. Good ROM, no contractures. Normal muscle tone.  Skin: Cellulitis to right wrist and forearm area with obvious injection sites with no fluctuance some induration without any obvious abscess per patient report over 30% improved since antibiotic started last night mild amount of swelling to the wrist and hand neurologic: CN  2-12 grossly intact. Sensation intact, DTR normal. Strength 5/5 in all 4.  Psychiatric: Normal judgment and insight. Alert and oriented x 3. Normal mood.    Labs on Admission: I have personally reviewed following labs and imaging studies  CBC: Recent Labs  Lab 02/05/22 2253  WBC 6.2  NEUTROABS 4.2  HGB 14.3  HCT 40.5  MCV 87.7  PLT 149*   Basic Metabolic Panel: Recent Labs  Lab 02/05/22 2253  NA 136  K 3.7  CL 98  CO2 29  GLUCOSE 108*  BUN 12  CREATININE 0.70  CALCIUM 9.2   GFR: Estimated Creatinine Clearance: 93.6 mL/min (by C-G formula based on SCr of 0.7 mg/dL). Liver Function Tests: Recent Labs  Lab 02/05/22 2253  AST 56*  ALT 102*  ALKPHOS 58  BILITOT 0.6  PROT 7.6  ALBUMIN 4.2   No results for input(s): LIPASE, AMYLASE in the last 168 hours. No results for input(s): AMMONIA in the last 168 hours. Coagulation Profile: Recent Labs  Lab 02/05/22 2253  INR 1.1   Cardiac Enzymes: No results for input(s): CKTOTAL, CKMB, CKMBINDEX, TROPONINI in the last 168 hours. BNP (last 3 results) No results for input(s): PROBNP in the last 8760 hours. HbA1C: No results for input(s): HGBA1C in the last 72 hours. CBG: No results for input(s): GLUCAP in the last 168 hours. Lipid Profile: No results for input(s): CHOL, HDL, LDLCALC, TRIG, CHOLHDL, LDLDIRECT in the last 72 hours. Thyroid Function Tests: No results for input(s): TSH, T4TOTAL, FREET4, T3FREE, THYROIDAB in the last 72 hours. Anemia Panel: No results for input(s): VITAMINB12, FOLATE, FERRITIN, TIBC, IRON, RETICCTPCT in the last 72 hours. Urine analysis:    Component Value Date/Time   COLORURINE YELLOW 02/06/2022 0745   APPEARANCEUR HAZY (A) 02/06/2022 0745   LABSPEC 1.033 (H) 02/06/2022 0745   PHURINE 6.5 02/06/2022 0745   GLUCOSEU NEGATIVE 02/06/2022 0745   HGBUR NEGATIVE 02/06/2022 0745   BILIRUBINUR NEGATIVE 02/06/2022 0745   KETONESUR 15 (A) 02/06/2022 0745   PROTEINUR 30 (A) 02/06/2022 0745    UROBILINOGEN 1.0 03/29/2015 0530   NITRITE NEGATIVE 02/06/2022 0745   LEUKOCYTESUR SMALL (A) 02/06/2022 0745   Sepsis Labs: !!!!!!!!!!!!!!!!!!!!!!!!!!!!!!!!!!!!!!!!!!!! @LABRCNTIP (procalcitonin:4,lacticidven:4) ) Recent Results (from the past 240 hour(s))  Resp Panel by RT-PCR (Flu A&B, Covid) Nasopharyngeal Swab     Status: None   Collection Time: 02/05/22 11:08 PM   Specimen: Nasopharyngeal Swab; Nasopharyngeal(NP) swabs in vial transport medium  Result Value Ref Range Status   SARS Coronavirus 2 by RT PCR NEGATIVE NEGATIVE Final    Comment: (NOTE) SARS-CoV-2 target nucleic acids are NOT DETECTED.  The SARS-CoV-2 RNA is generally detectable in upper respiratory specimens during the acute phase of infection. The lowest concentration of SARS-CoV-2 viral copies this assay can detect is 138 copies/mL. A negative result does not preclude SARS-Cov-2 infection and should not be used as  the sole basis for treatment or other patient management decisions. A negative result may occur with  improper specimen collection/handling, submission of specimen other than nasopharyngeal swab, presence of viral mutation(s) within the areas targeted by this assay, and inadequate number of viral copies(<138 copies/mL). A negative result must be combined with clinical observations, patient history, and epidemiological information. The expected result is Negative.  Fact Sheet for Patients:  BloggerCourse.com  Fact Sheet for Healthcare Providers:  SeriousBroker.it  This test is no t yet approved or cleared by the [place] FDA and  has been authorized for detection and/or diagnosis of SARS-CoV-2 by FDA under an Emergency Use Authorization (EUA). This EUA will remain  in effect (meaning this test can be used) for the duration of the COVID-19 declaration under Section 564(b)(1) of the Act, 21 U.S.C.section 360bbb-3(b)(1), unless the authorization is  terminated  or revoked sooner.       Influenza A by PCR NEGATIVE NEGATIVE Final   Influenza B by PCR NEGATIVE NEGATIVE Final    Comment: (NOTE) The Xpert Xpress SARS-CoV-2/FLU/RSV plus assay is intended as an aid in the diagnosis of influenza from Nasopharyngeal swab specimens and should not be used as a sole basis for treatment. Nasal washings and aspirates are unacceptable for Xpert Xpress SARS-CoV-2/FLU/RSV testing.  Fact Sheet for Patients: BloggerCourse.com  Fact Sheet for Healthcare Providers: SeriousBroker.it  This test is not yet approved or cleared by the [place] FDA and has been authorized for detection and/or diagnosis of SARS-CoV-2 by FDA under an Emergency Use Authorization (EUA). This EUA will remain in effect (meaning this test can be used) for the duration of the COVID-19 declaration under Section 564(b)(1) of the Act, 21 U.S.C. section 360bbb-3(b)(1), unless the authorization is terminated or revoked.  Performed at [facility], [address] [place], [place] [address]      Radiological Exams on Admission: DG Chest 2 View  Result Date: 02/05/2022 CLINICAL DATA:  Suspected sepsis EXAM: CHEST - 2 VIEW COMPARISON:  Report 03/25/2014 FINDINGS: The heart size and mediastinal contours are within normal limits. Both lungs are clear. The visualized skeletal structures are unremarkable. IMPRESSION: No active cardiopulmonary disease. Electronically Signed   By: Jasmine Pang M.D.   On: 02/05/2022 23:49   DG Forearm Right  Result Date: 02/05/2022 CLINICAL DATA:  Infection red and swollen EXAM: RIGHT FOREARM - 2 VIEW COMPARISON:  None. FINDINGS: No fracture or malalignment. No periostitis or frank osseous destructive change. No radiopaque foreign body. No significant elbow effusion. Generalized edema within the soft tissues of the forearm. No soft tissue gas IMPRESSION: Soft tissue edema.  No acute  osseous abnormality. Electronically Signed   By: Jasmine Pang M.D.   On: 02/05/2022 23:50    Old chart reviewed   assessment/Plan  26 year old female IV drug abuse comes in septic from right upper extremity cellulitis from injection site  Principal Problem:    Sepsis (HCC)-initial lactic acid almost 3.  Given 30 cc/kg IV fluids septic bolus at [facility] with resolution of lactic acidosis.  Given vancomycin cefepime and Flagyl.  Sepsis resolving.  Responsive to IV fluids.  Currently blood pressure stable.  Still with fever.  Continue vancomycin and cefepime at this time.  Blood cultures pending.  If patient continues to spike fevers despite antibiotics would proceed with further work-up for other occult sources of infection.  Active Problems:    Cellulitis right upper extremity-clinically improving with IV antibiotics.  As above if patient continues to spike fevers  despite antibiotics would proceed with more extensive work-up.  We will hold off at this time.  There is no murmur on exam.    IVDA (intravenous drug abuse) complicating pregnancy (HCC)-patient wishes to sober back up Suboxone has worked for her in the past.  Consider Suboxone if patient can ensure appropriate outpatient follow-up.  No withdrawal at this time.    Further recommendations pending over hospital course   DVT prophylaxis: Ambulate Code Status: Full Family Communication: None Disposition Plan: 1 to 2 days if remains afebrile Consults called: None Admission status: Admission   Kjerstin Abrigo A MD [facility]  If 7PM-7AM, please contact night-coverage [email or web address] Password [email or web address]  02/06/2022, 4:24 PM

## 2022-02-07 ENCOUNTER — Encounter (HOSPITAL_COMMUNITY): Payer: Self-pay | Admitting: Family Medicine

## 2022-02-07 DIAGNOSIS — R7881 Bacteremia: Secondary | ICD-10-CM

## 2022-02-07 DIAGNOSIS — F1990 Other psychoactive substance use, unspecified, uncomplicated: Secondary | ICD-10-CM

## 2022-02-07 DIAGNOSIS — A409 Streptococcal sepsis, unspecified: Principal | ICD-10-CM

## 2022-02-07 DIAGNOSIS — O99320 Drug use complicating pregnancy, unspecified trimester: Secondary | ICD-10-CM

## 2022-02-07 DIAGNOSIS — L03113 Cellulitis of right upper limb: Secondary | ICD-10-CM

## 2022-02-07 DIAGNOSIS — F1910 Other psychoactive substance abuse, uncomplicated: Secondary | ICD-10-CM

## 2022-02-07 DIAGNOSIS — B955 Unspecified streptococcus as the cause of diseases classified elsewhere: Secondary | ICD-10-CM

## 2022-02-07 LAB — COMPREHENSIVE METABOLIC PANEL
ALT: 63 U/L — ABNORMAL HIGH (ref 0–44)
AST: 36 U/L (ref 15–41)
Albumin: 2.6 g/dL — ABNORMAL LOW (ref 3.5–5.0)
Alkaline Phosphatase: 46 U/L (ref 38–126)
Anion gap: 7 (ref 5–15)
BUN: 6 mg/dL (ref 6–20)
CO2: 25 mmol/L (ref 22–32)
Calcium: 7.8 mg/dL — ABNORMAL LOW (ref 8.9–10.3)
Chloride: 103 mmol/L (ref 98–111)
Creatinine, Ser: 0.44 mg/dL (ref 0.44–1.00)
GFR, Estimated: >60 mL/min (ref >60)
Glucose, Bld: 109 mg/dL — ABNORMAL HIGH (ref 70–99)
Potassium: 3.6 mmol/L (ref 3.5–5.1)
Sodium: 135 mmol/L (ref 135–145)
Total Bilirubin: 0.4 mg/dL (ref 0.3–1.2)
Total Protein: 5.3 g/dL — ABNORMAL LOW (ref 6.5–8.1)

## 2022-02-07 MED ORDER — KETOROLAC TROMETHAMINE 15 MG/ML IJ SOLN: 15.0000 mg | Freq: Four times a day (QID) | INTRAMUSCULAR | Status: DC | PRN

## 2022-02-07 MED ORDER — METHADONE HCL 10 MG/ML IJ SOLN
10.0000 mg | Freq: Four times a day (QID) | INTRAMUSCULAR | Status: DC | PRN
  Filled 2022-02-07 (×5): qty 1

## 2022-02-07 MED ORDER — CLINDAMYCIN PHOSPHATE 600 MG/50ML IV SOLN
600.0000 mg | Freq: Three times a day (TID) | INTRAVENOUS | Status: DC
  Administered 2022-02-07 – 2022-02-09 (×6): 600 mg via INTRAVENOUS
  Filled 2022-02-07 (×6): qty 50

## 2022-02-07 MED ORDER — METHADONE HCL 10 MG PO TABS
10.0000 mg | ORAL_TABLET | Freq: Four times a day (QID) | ORAL | Status: AC | PRN
  Administered 2022-02-07 – 2022-02-08 (×3): 10 mg via ORAL
  Filled 2022-02-07 (×3): qty 1

## 2022-02-07 NOTE — Progress Notes (Addendum)
Progress Note    Belinda Wilson  ZOX:[id number] DOB: 07/21/1996  DOA: 02/05/2022 PCP: Patient, No Pcp Per (Inactive)      Brief Narrative:    Medical records reviewed and are as summarized below:  Belinda Wilson is a 26 y.o. female with medical history significant for IV drug use, endocarditis 2020, who presented to the hospital because of painful swelling and redness of her right forearm.  She was admitted to the hospital for sepsis secondary to cellulitis of the right upper extremity.  Blood cultures were supposed to for strep pyogenes consistent with strep pyogenes bacteremia.  She was treated with IV antibiotics and analgesics.    Assessment/Plan:   Principal Problem:   Sepsis (HCC) Active Problems:   Cellulitis   Streptococcal bacteremia   IVDA (intravenous drug abuse) complicating pregnancy (HCC)   Body mass index is 24.33 kg/m.   Sepsis secondary to right upper extremity cellulitis, strep pyogenes bacteremia: Continue IV penicillin and analgesics as needed for pain.  Obtain 2D echo to look for vegetations.   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   Diet Order             Diet regular Room service appropriate? Yes; Fluid consistency: Thin  Diet effective now                    Consultants: Infectious disease  Procedures: None    Medications:    sodium chloride flush  3 mL Intravenous Q12H   Continuous Infusions:  sodium chloride     clindamycin (CLEOCIN) IV 600 mg (02/07/22 1338)   penicillin g continuous IV infusion 12 Million Units (02/07/22 1023)     Anti-infectives (From admission, onward)    Start     Dose/Rate Route Frequency Ordered Stop   02/07/22 1300  clindamycin (CLEOCIN) IVPB 600 mg        600 mg 100 mL/hr over 30 Minutes Intravenous Every 8 hours 02/07/22 1205     02/06/22 2215  penicillin G potassium 12 Million Units in dextrose 5 % 500 mL continuous infusion        12 Million  Units 41.7 mL/hr over 12 Hours Intravenous 2 times daily 02/06/22 2122     02/06/22 1830  vancomycin (VANCOREADY) IVPB 500 mg/100 mL  Status:  Discontinued        500 mg 100 mL/hr over 60 Minutes Intravenous Every 8 hours 02/06/22 1027 02/06/22 2122   02/06/22 1715  ceFEPIme (MAXIPIME) 2 g in sodium chloride 0.9 % 100 mL IVPB  Status:  Discontinued        2 g 200 mL/hr over 30 Minutes Intravenous Every 24 hours 02/06/22 1627 02/06/22 1629   02/06/22 1100  vancomycin (VANCOREADY) IVPB 750 mg/150 mL  Status:  Discontinued        750 mg 150 mL/hr over 60 Minutes Intravenous Every 12 hours 02/06/22 0015 02/06/22 1025   02/06/22 1030  vancomycin (VANCOCIN) 500 mg in sodium chloride 0.9 % 100 mL IVPB        500 mg 100 mL/hr over 60 Minutes Intravenous  Once 02/06/22 1027 02/06/22 1231   02/06/22 1029  vancomycin (VANCOCIN) 500 MG powder       Note to Pharmacy: Belinda LandBarber, Margaret Wilson: cabinet override      02/06/22 1029 02/06/22 2244   02/06/22 0600  ceFEPIme (MAXIPIME) 2 g in sodium chloride 0.9 % 100 mL IVPB  Status:  Discontinued  2 g 200 mL/hr over 30 Minutes Intravenous Every 8 hours 02/06/22 0015 02/06/22 2122   02/05/22 2315  ceFEPIme (MAXIPIME) 2 g in sodium chloride 0.9 % 100 mL IVPB        2 g 200 mL/hr over 30 Minutes Intravenous  Once 02/05/22 2308 02/06/22 0038   02/05/22 2315  metroNIDAZOLE (FLAGYL) IVPB 500 mg        500 mg 100 mL/hr over 60 Minutes Intravenous  Once 02/05/22 2308 02/06/22 0038   02/05/22 2315  vancomycin (VANCOCIN) IVPB 1000 mg/200 mL premix        1,000 mg 200 mL/hr over 60 Minutes Intravenous  Once 02/05/22 2308 02/06/22 0038              Family Communication/Anticipated D/C date and plan/Code Status   DVT prophylaxis: SCDs Start: 02/06/22 1628     Code Status: Full Code  Family Communication: None Disposition Plan: Plan to discharge home when medically stable   Status is: Inpatient Remains inpatient appropriate because: IV antibiotics  for strep bacteremia and right upper extremity cellulitis               Subjective:   c/o b/l leg pain, back pain and right forearm pain. She thinks she's withdrawing fron  opioids.  Ardeen Fillers, RN, was at the bedside  Objective:    Vitals:   02/06/22 1818 02/06/22 2307 02/07/22 0317 02/07/22 1138  BP: (!) 92/50 104/62 108/65 (!) 90/50  Pulse: 92 90 89 91  Resp: 16 16 17 18   Temp: 98 F (36.7 C) 98.7 F (37.1 C) 98.8 F (37.1 C) 98.4 F (36.9 C)  TempSrc: Oral Oral Oral Oral  SpO2: 100% 100% 100% 96%  Weight:      Height:       No data found.   Intake/Output Summary (Last 24 hours) at 02/07/2022 1433 Last data filed at 02/07/2022 04/07/2022 Gross per 24 hour  Intake 388.97 ml  Output --  Net 388.97 ml   Filed Weights   02/05/22 2217 02/06/22 1517  Weight: 52.2 kg 64.3 kg    Exam:  GEN: NAD SKIN: No rash EYES: EOMI ENT: MMM CV: RRR PULM: CTA B ABD: soft, ND, NT, +BS CNS: AAO x 3, non focal EXT: Swelling, erythema and tenderness of the right forearm        Data Reviewed:   I have personally reviewed following labs and imaging studies:  Labs: Labs show the following:   Basic Metabolic Panel: Recent Labs  Lab 02/05/22 2253 02/07/22 0702  NA 136 135  K 3.7 3.6  CL 98 103  CO2 29 25  GLUCOSE 108* 109*  BUN 12 6  CREATININE 0.70 0.44  CALCIUM 9.2 7.8*   GFR Estimated Creatinine Clearance: 93.6 mL/min (by C-G formula based on SCr of 0.44 mg/dL). Liver Function Tests: Recent Labs  Lab 02/05/22 2253 02/07/22 0702  AST 56* 36  ALT 102* 63*  ALKPHOS 58 46  BILITOT 0.6 0.4  PROT 7.6 5.3*  ALBUMIN 4.2 2.6*   No results for input(s): LIPASE, AMYLASE in the last 168 hours. No results for input(s): AMMONIA in the last 168 hours. Coagulation profile Recent Labs  Lab 02/05/22 2253  INR 1.1    CBC: Recent Labs  Lab 02/05/22 2253  WBC 6.2  NEUTROABS 4.2  HGB 14.3  HCT 40.5  MCV 87.7  PLT 149*   Cardiac Enzymes: No results for  input(s): CKTOTAL, CKMB, CKMBINDEX, TROPONINI in the last 168 hours. BNP (  last 3 results) No results for input(s): PROBNP in the last 8760 hours. CBG: No results for input(s): GLUCAP in the last 168 hours. D-Dimer: No results for input(s): DDIMER in the last 72 hours. Hgb A1c: No results for input(s): HGBA1C in the last 72 hours. Lipid Profile: No results for input(s): CHOL, HDL, LDLCALC, TRIG, CHOLHDL, LDLDIRECT in the last 72 hours. Thyroid function studies: No results for input(s): TSH, T4TOTAL, T3FREE, THYROIDAB in the last 72 hours.  Invalid input(s): FREET3 Anemia work up: No results for input(s): VITAMINB12, FOLATE, FERRITIN, TIBC, IRON, RETICCTPCT in the last 72 hours. Sepsis Labs: Recent Labs  Lab 02/05/22 2253 02/06/22 0134  WBC 6.2  --   LATICACIDVEN 2.6* 1.1    Microbiology Recent Results (from the past 240 hour(s))  Culture, blood (Routine x 2)     Status: None (Preliminary result)   Collection Time: 02/05/22 10:53 PM   Specimen: Vein; Blood  Result Value Ref Range Status   Specimen Description   Final    Blood Culture adequate volume BOTTLES DRAWN AEROBIC AND ANAEROBIC Performed at [facility], [address] [place], [place] [address]    Special Requests   Final    BLOOD LEFT ARM UPPER Performed at [facility], [address], [place], [place] [address]    Culture  Setup Time   Final    GRAM POSITIVE COCCI IN BOTH AEROBIC AND ANAEROBIC BOTTLES CRITICAL RESULT CALLED TO, READ BACK BY AND VERIFIED WITH: PHARMD ELLEN JACKSON 02/06/22@23 :30 BY TW    Culture   Final    GRAM POSITIVE COCCI TOO YOUNG TO READ Performed at [facility], [address], [place], [place] [address]    Report Status PENDING  Incomplete  Blood Culture ID Panel (Reflexed)     Status: Abnormal   Collection Time: 02/05/22 10:53 PM  Result Value Ref Range Status   Enterococcus faecalis NOT DETECTED NOT DETECTED Final   Enterococcus Faecium NOT  DETECTED NOT DETECTED Final   Listeria monocytogenes NOT DETECTED NOT DETECTED Final   Staphylococcus species NOT DETECTED NOT DETECTED Final   Staphylococcus aureus (BCID) NOT DETECTED NOT DETECTED Final   Staphylococcus epidermidis NOT DETECTED NOT DETECTED Final   Staphylococcus lugdunensis NOT DETECTED NOT DETECTED Final   Streptococcus species DETECTED (A) NOT DETECTED Final    Comment: CRITICAL RESULT CALLED TO, READ BACK BY AND VERIFIED WITH: PHARMD ELLEN JACKSON 02/06/22@23 :30 BY TW    Streptococcus agalactiae NOT DETECTED NOT DETECTED Final   Streptococcus pneumoniae NOT DETECTED NOT DETECTED Final   Streptococcus pyogenes DETECTED (A) NOT DETECTED Final    Comment: CRITICAL RESULT CALLED TO, READ BACK BY AND VERIFIED WITH: PHARMD ELLEN JACKSON 02/06/22@23 :30 BY TW    A.calcoaceticus-baumannii NOT DETECTED NOT DETECTED Final   Bacteroides fragilis NOT DETECTED NOT DETECTED Final   Enterobacterales NOT DETECTED NOT DETECTED Final   Enterobacter cloacae complex NOT DETECTED NOT DETECTED Final   Escherichia coli NOT DETECTED NOT DETECTED Final   Klebsiella aerogenes NOT DETECTED NOT DETECTED Final   Klebsiella oxytoca NOT DETECTED NOT DETECTED Final   Klebsiella pneumoniae NOT DETECTED NOT DETECTED Final   Proteus species NOT DETECTED NOT DETECTED Final   Salmonella species NOT DETECTED NOT DETECTED Final   Serratia marcescens NOT DETECTED NOT DETECTED Final   Haemophilus influenzae NOT DETECTED NOT DETECTED Final   Neisseria meningitidis NOT DETECTED NOT DETECTED Final   Pseudomonas aeruginosa NOT DETECTED NOT DETECTED Final   Stenotrophomonas maltophilia NOT DETECTED NOT  DETECTED Final   Candida albicans NOT DETECTED NOT DETECTED Final   Candida auris NOT DETECTED NOT DETECTED Final   Candida glabrata NOT DETECTED NOT DETECTED Final   Candida krusei NOT DETECTED NOT DETECTED Final   Candida parapsilosis NOT DETECTED NOT DETECTED Final   Candida tropicalis NOT DETECTED NOT  DETECTED Final   Cryptococcus neoformans/gattii NOT DETECTED NOT DETECTED Final    Comment: Performed at [facility], [address] [place], [place] [address]  Resp Panel by RT-PCR (Flu A&B, Covid) Nasopharyngeal Swab     Status: None   Collection Time: 02/05/22 11:08 PM   Specimen: Nasopharyngeal Swab; Nasopharyngeal(NP) swabs in vial transport medium  Result Value Ref Range Status   SARS Coronavirus 2 by RT PCR NEGATIVE NEGATIVE Final    Comment: (NOTE) SARS-CoV-2 target nucleic acids are NOT DETECTED.  The SARS-CoV-2 RNA is generally detectable in upper respiratory specimens during the acute phase of infection. The lowest concentration of SARS-CoV-2 viral copies this assay can detect is 138 copies/mL. A negative result does not preclude SARS-Cov-2 infection and should not be used as the sole basis for treatment or other patient management decisions. A negative result may occur with  improper specimen collection/handling, submission of specimen other than nasopharyngeal swab, presence of viral mutation(s) within the areas targeted by this assay, and inadequate number of viral copies(<138 copies/mL). A negative result must be combined with clinical observations, patient history, and epidemiological information. The expected result is Negative.  Fact Sheet for Patients:  BloggerCourse.comhttps://www.fda.gov/media/152166/download  Fact Sheet for Healthcare Providers:  SeriousBroker.ithttps://www.fda.gov/media/152162/download  This test is no t yet approved or cleared by the [place] FDA and  has been authorized for detection and/or diagnosis of SARS-CoV-2 by FDA under an Emergency Use Authorization (EUA). This EUA will remain  in effect (meaning this test can be used) for the duration of the COVID-19 declaration under Section 564(b)(1) of the Act, 21 U.S.C.section 360bbb-3(b)(1), unless the authorization is terminated  or revoked sooner.       Influenza A by PCR NEGATIVE NEGATIVE Final    Influenza B by PCR NEGATIVE NEGATIVE Final    Comment: (NOTE) The Xpert Xpress SARS-CoV-2/FLU/RSV plus assay is intended as an aid in the diagnosis of influenza from Nasopharyngeal swab specimens and should not be used as a sole basis for treatment. Nasal washings and aspirates are unacceptable for Xpert Xpress SARS-CoV-2/FLU/RSV testing.  Fact Sheet for Patients: BloggerCourse.comhttps://www.fda.gov/media/152166/download  Fact Sheet for Healthcare Providers: SeriousBroker.ithttps://www.fda.gov/media/152162/download  This test is not yet approved or cleared by the [place] FDA and has been authorized for detection and/or diagnosis of SARS-CoV-2 by FDA under an Emergency Use Authorization (EUA). This EUA will remain in effect (meaning this test can be used) for the duration of the COVID-19 declaration under Section 564(b)(1) of the Act, 21 U.S.C. section 360bbb-3(b)(1), unless the authorization is terminated or revoked.  Performed at [facility], [address], [place], [place] [address]   Culture, blood (Routine x 2)     Status: Abnormal (Preliminary result)   Collection Time: 02/05/22 11:20 PM   Specimen: BLOOD  Result Value Ref Range Status   Specimen Description   Final    BLOOD Blood Culture adequate volume Performed at [facility], [address], [place], [place] [address]    Special Requests   Final    BLOOD LEFT HAND BOTTLES DRAWN AEROBIC AND ANAEROBIC   Culture  Setup Time   Final    GRAM POSITIVE COCCI IN  CHAINS IN PAIRS IN BOTH AEROBIC AND ANAEROBIC BOTTLES CRITICAL VALUE NOTED.  VALUE IS CONSISTENT WITH PREVIOUSLY REPORTED AND CALLED VALUE.    Culture (A)  Final    STREPTOCOCCUS PYOGENES SUSCEPTIBILITIES TO FOLLOW CULTURE REINCUBATED FOR BETTER GROWTH Performed at [facility], [address] [place], [place] [address]    Report Status PENDING  Incomplete  Blood Culture ID Panel (Reflexed)     Status: Abnormal   Collection Time:  02/05/22 11:20 PM  Result Value Ref Range Status   Enterococcus faecalis NOT DETECTED NOT DETECTED Final   Enterococcus Faecium NOT DETECTED NOT DETECTED Final   Listeria monocytogenes NOT DETECTED NOT DETECTED Final   Staphylococcus species NOT DETECTED NOT DETECTED Final   Staphylococcus aureus (BCID) NOT DETECTED NOT DETECTED Final   Staphylococcus epidermidis NOT DETECTED NOT DETECTED Final   Staphylococcus lugdunensis NOT DETECTED NOT DETECTED Final   Streptococcus species DETECTED (A) NOT DETECTED Final    Comment: CRITICAL RESULT CALLED TO, READ BACK BY AND VERIFIED WITH: D WOFFORD PHARMD @2004  02/06/22 EB    Streptococcus agalactiae NOT DETECTED NOT DETECTED Final   Streptococcus pneumoniae NOT DETECTED NOT DETECTED Final   Streptococcus pyogenes DETECTED (A) NOT DETECTED Final    Comment: CRITICAL RESULT CALLED TO, READ BACK BY AND VERIFIED WITH: D WOFFORD PHARMD @2004  02/06/22 EB    A.calcoaceticus-baumannii NOT DETECTED NOT DETECTED Final   Bacteroides fragilis NOT DETECTED NOT DETECTED Final   Enterobacterales NOT DETECTED NOT DETECTED Final   Enterobacter cloacae complex NOT DETECTED NOT DETECTED Final   Escherichia coli NOT DETECTED NOT DETECTED Final   Klebsiella aerogenes NOT DETECTED NOT DETECTED Final   Klebsiella oxytoca NOT DETECTED NOT DETECTED Final   Klebsiella pneumoniae NOT DETECTED NOT DETECTED Final   Proteus species NOT DETECTED NOT DETECTED Final   Salmonella species NOT DETECTED NOT DETECTED Final   Serratia marcescens NOT DETECTED NOT DETECTED Final   Haemophilus influenzae NOT DETECTED NOT DETECTED Final   Neisseria meningitidis NOT DETECTED NOT DETECTED Final   Pseudomonas aeruginosa NOT DETECTED NOT DETECTED Final   Stenotrophomonas maltophilia NOT DETECTED NOT DETECTED Final   Candida albicans NOT DETECTED NOT DETECTED Final   Candida auris NOT DETECTED NOT DETECTED Final   Candida glabrata NOT DETECTED NOT DETECTED Final   Candida krusei NOT  DETECTED NOT DETECTED Final   Candida parapsilosis NOT DETECTED NOT DETECTED Final   Candida tropicalis NOT DETECTED NOT DETECTED Final   Cryptococcus neoformans/gattii NOT DETECTED NOT DETECTED Final    Comment: Performed at [facility], [address] [place], [address] [place]    Procedures and diagnostic studies:  DG Chest 2 View  Result Date: 02/05/2022 CLINICAL DATA:  Suspected sepsis EXAM: CHEST - 2 VIEW COMPARISON:  Report 03/25/2014 FINDINGS: The heart size and mediastinal contours are within normal limits. Both lungs are clear. The visualized skeletal structures are unremarkable. IMPRESSION: No active cardiopulmonary disease. Electronically Signed   By: 04/05/2022 M.D.   On: 02/05/2022 23:49   DG Forearm Right  Result Date: 02/05/2022 CLINICAL DATA:  Infection red and swollen EXAM: RIGHT FOREARM - 2 VIEW COMPARISON:  None. FINDINGS: No fracture or malalignment. No periostitis or frank osseous destructive change. No radiopaque foreign body. No significant elbow effusion. Generalized edema within the soft tissues of the forearm. No soft tissue gas IMPRESSION: Soft tissue edema.  No acute osseous abnormality. Electronically Signed   By: 04/05/2022 M.D.   On: 02/05/2022 23:50  LOS: 1 day   Briget Shaheed  [facility]   Pager on [email or web address]. If 7PM-7AM, please contact night-coverage at [email or web address]     02/07/2022, 2:33 PM

## 2022-02-07 NOTE — Plan of Care (Signed)
  Problem: Clinical Measurements: Goal: Diagnostic test results will improve Outcome: Progressing   Problem: Coping: Goal: Level of anxiety will decrease Outcome: Progressing   Problem: Safety: Goal: Ability to remain free from injury will improve Outcome: Progressing   

## 2022-02-07 NOTE — Consult Note (Addendum)
[facility]                                                                                        Patient Identification: Patient Name: Belinda Wilson MRN: [id number] [place] Date: 02/05/2022 10:28 PM Today's Date: 02/07/2022 Reason for consult: Streptococcus pyogenes bacteremia Requesting provider: [facility] consult   Principal Problem:   Sepsis [facility]) Active Problems:   Cellulitis   IVDA (intravenous drug abuse) complicating pregnancy ([place])  Antibiotics: cefepime 2/9-2/10                    Metronidazole 2/9                    Pen G 2/10-current                    Vancomycin 2/9-2/10  Lines/Hardware:  Assessment 26 Y O female wth PMH of Anxiety and Depression, active IVDU, 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 Currently, she tells pain and swelling has significantly improved since she presented  TTE given Strep bacteremia, IVD and prior h/o Endocarditis HIV, HCV ab and Hep B serology  Monitor CBC and BMP  Dr Linus Salmons will follow from Monday.   Rest of the management as per the primary team. Please call with questions or concerns.  Thank you for the consult  Rosiland Oz, MD Infectious Disease Physician [facility] [address] [place], [place] [address] Phone: [phone]   Fax: [phone]  __________________________________________________________________________________________________________ HPI and Hospital Course: 47 Y O female wth PMH of Anxiety and Depression,  active IVDU, history of endocarditis in 2020, TVR who presented to the ED on 2/10 with approximately 2 days of swelling and redness in the right forearm where she injected IV heroin.  Initially presented to [facility] urgent care where she was given vancomycin, cefepime and Flagyl with marked improvement in the swelling and redness.  Patient transferred to [facility] for further work-up. She injected heroin in the left forearm/wrist area on Sunday night. Useds V heroin over 1 gm every day. Tells me she has a leaky valve/valve infection in 2020 where she was treated at [facility] with Vancomycin for several weeks.  At ED febrile with Tmax 101.8, AST 56 ALT 102.  Lactic acid 2.6 platelets 149 Blood cultures 2/9 both sets Streptococcus pyogenes Initially on vancomycin, cefepime and metronidazole which has been de-escalated to penicillin G with positive blood cultures Right forearm x-ray with soft tissue edema no acute osseous abnormality Chest x-ray with no acute abnormality  ROS: fevers + Denies chills and sweats Denies nausea, vomiting and diarrhea No pain/swelling in other joints All systems reviewed and negative except as above  Past Medical History:  Diagnosis Date   Anxiety  Depression    IV drug abuse (HCC)    Tricuspid regurgitation    History reviewed. No pertinent surgical history.   Scheduled Meds:  sodium chloride flush  3 mL Intravenous Q12H   Continuous Infusions:  sodium chloride     penicillin g continuous IV infusion 12 Million Units (02/06/22 2352)   PRN Meds:.sodium chloride, sodium chloride flush  No Known Allergies  Social History   Socioeconomic History   Marital status: Single    Spouse name: Not on file   Number of children: Not on file   Years of education: Not on file   Highest education level: Not on file  Occupational History   Not on file  Tobacco Use   Smoking status: Never   Smokeless tobacco: Not on file  Vaping Use   Vaping Use: Never used   Substance and Sexual Activity   Alcohol use: No   Drug use: Yes    Types: Marijuana, IV    Comment: daily use of heroin and meth   Sexual activity: Yes    Birth control/protection: I.U.D.  Other Topics Concern   Not on file  Social History Narrative   Not on file   Social Determinants of Health   Financial Resource Strain: Not on file  Food Insecurity: Not on file  Transportation Needs: Not on file  Physical Activity: Not on file  Stress: Not on file  Social Connections: Not on file  Intimate Partner Violence: Not on file   Family History  Problem Relation Age of Onset   Hypertension Paternal Grandfather    Asthma Paternal Grandfather    Diabetes Maternal Grandmother     Vitals BP 108/65 (BP Location: Right Arm)    Pulse 89    Temp 98.8 F (37.1 C) (Oral)    Resp 17    Ht 5\' 4"  (1.626 m)    Wt 64.3 kg    SpO2 100%    BMI 24.33 kg/m    Physical Exam Constitutional:  sitting in the bed, and appears comfortable     Comments:   Cardiovascular:     Rate and Rhythm: Normal rate and regular rhythm.     Heart sounds:  Pulmonary:     Effort: Pulmonary effort is normal.     Comments: clear lung sounds   Abdominal:     Palpations: Abdomen is soft.     Tenderness: non distended  Musculoskeletal:        General:     Comments:      Neurological:     General: No focal deficit present.   Psychiatric:        Mood and Affect: Mood normal. Awake, alert and oriented * 3   Pertinent Microbiology Results for orders placed or performed during the hospital encounter of 02/05/22  Culture, blood (Routine x 2)     Status: None (Preliminary result)   Collection Time: 02/05/22 10:53 PM   Specimen: Vein; Blood  Result Value Ref Range Status   Specimen Description   Final    Blood Culture adequate volume BOTTLES DRAWN AEROBIC AND ANAEROBIC Performed at [facility], [place] [address], [place], [place] [address]    Special Requests   Final    BLOOD LEFT ARM  UPPER Performed at [facility], [place], [place] [address]    Culture  Setup Time   Final    GRAM POSITIVE COCCI IN BOTH AEROBIC AND ANAEROBIC BOTTLES CRITICAL RESULT CALLED TO, READ  BACK BY AND VERIFIED WITH: PHARMD ELLEN JACKSON 02/06/22@23 :30 BY TW    Culture   Final    GRAM POSITIVE COCCI TOO YOUNG TO READ Performed at [facility], [place] [address] [place], [place] [address]    Report Status PENDING  Incomplete  Blood Culture ID Panel (Reflexed)     Status: Abnormal   Collection Time: 02/05/22 10:53 PM  Result Value Ref Range Status   Enterococcus faecalis NOT DETECTED NOT DETECTED Final   Enterococcus Faecium NOT DETECTED NOT DETECTED Final   Listeria monocytogenes NOT DETECTED NOT DETECTED Final   Staphylococcus species NOT DETECTED NOT DETECTED Final   Staphylococcus aureus (BCID) NOT DETECTED NOT DETECTED Final   Staphylococcus epidermidis NOT DETECTED NOT DETECTED Final   Staphylococcus lugdunensis NOT DETECTED NOT DETECTED Final   Streptococcus species DETECTED (A) NOT DETECTED Final    Comment: CRITICAL RESULT CALLED TO, READ BACK BY AND VERIFIED WITH: PHARMD ELLEN JACKSON 02/06/22@23 :30 BY TW    Streptococcus agalactiae NOT DETECTED NOT DETECTED Final   Streptococcus pneumoniae NOT DETECTED NOT DETECTED Final   Streptococcus pyogenes DETECTED (A) NOT DETECTED Final    Comment: CRITICAL RESULT CALLED TO, READ BACK BY AND VERIFIED WITH: PHARMD ELLEN JACKSON 02/06/22@23 :30 BY TW    A.calcoaceticus-baumannii NOT DETECTED NOT DETECTED Final   Bacteroides fragilis NOT DETECTED NOT DETECTED Final   Enterobacterales NOT DETECTED NOT DETECTED Final   Enterobacter cloacae complex NOT DETECTED NOT DETECTED Final   Escherichia coli NOT DETECTED NOT DETECTED Final   Klebsiella aerogenes NOT DETECTED NOT DETECTED Final   Klebsiella oxytoca NOT DETECTED NOT DETECTED Final   Klebsiella pneumoniae NOT DETECTED NOT DETECTED Final   Proteus  species NOT DETECTED NOT DETECTED Final   Salmonella species NOT DETECTED NOT DETECTED Final   Serratia marcescens NOT DETECTED NOT DETECTED Final   Haemophilus influenzae NOT DETECTED NOT DETECTED Final   Neisseria meningitidis NOT DETECTED NOT DETECTED Final   Pseudomonas aeruginosa NOT DETECTED NOT DETECTED Final   Stenotrophomonas maltophilia NOT DETECTED NOT DETECTED Final   Candida albicans NOT DETECTED NOT DETECTED Final   Candida auris NOT DETECTED NOT DETECTED Final   Candida glabrata NOT DETECTED NOT DETECTED Final   Candida krusei NOT DETECTED NOT DETECTED Final   Candida parapsilosis NOT DETECTED NOT DETECTED Final   Candida tropicalis NOT DETECTED NOT DETECTED Final   Cryptococcus neoformans/gattii NOT DETECTED NOT DETECTED Final    Comment: Performed at [facility], [address], [place], [place] [address]  Resp Panel by RT-PCR (Flu A&B, Covid) Nasopharyngeal Swab     Status: None   Collection Time: 02/05/22 11:08 PM   Specimen: Nasopharyngeal Swab; Nasopharyngeal(NP) swabs in vial transport medium  Result Value Ref Range Status   SARS Coronavirus 2 by RT PCR NEGATIVE NEGATIVE Final    Comment: (NOTE) SARS-CoV-2 target nucleic acids are NOT DETECTED.  The SARS-CoV-2 RNA is generally detectable in upper respiratory specimens during the acute phase of infection. The lowest concentration of SARS-CoV-2 viral copies this assay can detect is 138 copies/mL. A negative result does not preclude SARS-Cov-2 infection and should not be used as the sole basis for treatment or other patient management decisions. A negative result may occur with  improper specimen collection/handling, submission of specimen other than nasopharyngeal swab, presence of viral mutation(s) within the areas targeted by this assay, and inadequate number of viral copies(<138 copies/mL). A negative result must be combined with clinical observations, patient history, and epidemiological information.  The  expected result is Negative.  Fact Sheet for Patients:  EntrepreneurPulse.com.au  Fact Sheet for Healthcare Providers:  IncredibleEmployment.be  This test is no t yet approved or cleared by the [place] FDA and  has been authorized for detection and/or diagnosis of SARS-CoV-2 by FDA under an Emergency Use Authorization (EUA). This EUA will remain  in effect (meaning this test can be used) for the duration of the COVID-19 declaration under Section 564(b)(1) of the Act, 21 U.S.C.section 360bbb-3(b)(1), unless the authorization is terminated  or revoked sooner.       Influenza A by PCR NEGATIVE NEGATIVE Final   Influenza B by PCR NEGATIVE NEGATIVE Final    Comment: (NOTE) The Xpert Xpress SARS-CoV-2/FLU/RSV plus assay is intended as an aid in the diagnosis of influenza from Nasopharyngeal swab specimens and should not be used as a sole basis for treatment. Nasal washings and aspirates are unacceptable for Xpert Xpress SARS-CoV-2/FLU/RSV testing.  Fact Sheet for Patients: EntrepreneurPulse.com.au  Fact Sheet for Healthcare Providers: IncredibleEmployment.be  This test is not yet approved or cleared by the [place] FDA and has been authorized for detection and/or diagnosis of SARS-CoV-2 by FDA under an Emergency Use Authorization (EUA). This EUA will remain in effect (meaning this test can be used) for the duration of the COVID-19 declaration under Section 564(b)(1) of the Act, 21 U.S.C. section 360bbb-3(b)(1), unless the authorization is terminated or revoked.  Performed at [facility], [address], [place], [place] [address]   Culture, blood (Routine x 2)     Status: Abnormal (Preliminary result)   Collection Time: 02/05/22 11:20 PM   Specimen: BLOOD  Result Value Ref Range Status   Specimen Description   Final    BLOOD Blood Culture adequate volume Performed at  [facility], [address], [place], [place] [address]    Special Requests   Final    BLOOD LEFT HAND BOTTLES DRAWN AEROBIC AND ANAEROBIC   Culture  Setup Time   Final    GRAM POSITIVE COCCI IN CHAINS IN PAIRS IN BOTH AEROBIC AND ANAEROBIC BOTTLES CRITICAL VALUE NOTED.  VALUE IS CONSISTENT WITH PREVIOUSLY REPORTED AND CALLED VALUE.    Culture (A)  Final    STREPTOCOCCUS PYOGENES SUSCEPTIBILITIES TO FOLLOW CULTURE REINCUBATED FOR BETTER GROWTH Performed at [facility], [place] [address], [place], [place] [address]    Report Status PENDING  Incomplete  Blood Culture ID Panel (Reflexed)     Status: Abnormal   Collection Time: 02/05/22 11:20 PM  Result Value Ref Range Status   Enterococcus faecalis NOT DETECTED NOT DETECTED Final   Enterococcus Faecium NOT DETECTED NOT DETECTED Final   Listeria monocytogenes NOT DETECTED NOT DETECTED Final   Staphylococcus species NOT DETECTED NOT DETECTED Final   Staphylococcus aureus (BCID) NOT DETECTED NOT DETECTED Final   Staphylococcus epidermidis NOT DETECTED NOT DETECTED Final   Staphylococcus lugdunensis NOT DETECTED NOT DETECTED Final   Streptococcus species DETECTED (A) NOT DETECTED Final    Comment: CRITICAL RESULT CALLED TO, READ BACK BY AND VERIFIED WITH: D WOFFORD PHARMD @2004  02/06/22 EB    Streptococcus agalactiae NOT DETECTED NOT DETECTED Final   Streptococcus pneumoniae NOT DETECTED NOT DETECTED Final   Streptococcus pyogenes DETECTED (A) NOT DETECTED Final    Comment: CRITICAL RESULT CALLED TO, READ BACK BY AND VERIFIED WITH: D WOFFORD PHARMD @2004  02/06/22 EB    A.calcoaceticus-baumannii NOT DETECTED NOT DETECTED Final   Bacteroides fragilis NOT DETECTED NOT DETECTED Final   Enterobacterales NOT  DETECTED NOT DETECTED Final   Enterobacter cloacae complex NOT DETECTED NOT DETECTED Final   Escherichia coli NOT DETECTED NOT DETECTED Final   Klebsiella aerogenes NOT DETECTED NOT DETECTED Final   Klebsiella  oxytoca NOT DETECTED NOT DETECTED Final   Klebsiella pneumoniae NOT DETECTED NOT DETECTED Final   Proteus species NOT DETECTED NOT DETECTED Final   Salmonella species NOT DETECTED NOT DETECTED Final   Serratia marcescens NOT DETECTED NOT DETECTED Final   Haemophilus influenzae NOT DETECTED NOT DETECTED Final   Neisseria meningitidis NOT DETECTED NOT DETECTED Final   Pseudomonas aeruginosa NOT DETECTED NOT DETECTED Final   Stenotrophomonas maltophilia NOT DETECTED NOT DETECTED Final   Candida albicans NOT DETECTED NOT DETECTED Final   Candida auris NOT DETECTED NOT DETECTED Final   Candida glabrata NOT DETECTED NOT DETECTED Final   Candida krusei NOT DETECTED NOT DETECTED Final   Candida parapsilosis NOT DETECTED NOT DETECTED Final   Candida tropicalis NOT DETECTED NOT DETECTED Final   Cryptococcus neoformans/gattii NOT DETECTED NOT DETECTED Final    Comment: Performed at [facility], [place] [address] [place], [place] [address]   Pertinent Lab seen by me: CBC Latest Ref Rng & Units 02/05/2022 03/29/2015  WBC 4.0 - 10.5 K/uL 6.2 6.8  Hemoglobin 12.0 - 15.0 g/dL 14.3 13.3  Hematocrit 36.0 - 46.0 % 40.5 39.1  Platelets 150 - 400 K/uL 149(L) 261   CMP Latest Ref Rng & Units 02/07/2022 02/05/2022 03/29/2015  Glucose 70 - 99 mg/dL 109(H) 108(H) 88  BUN 6 - 20 mg/dL 6 12 10   Creatinine 0.44 - 1.00 mg/dL 0.44 0.70 0.59  Sodium 135 - 145 mmol/L 135 136 141  Potassium 3.5 - 5.1 mmol/L 3.6 3.7 3.8  Chloride 98 - 111 mmol/L 103 98 106  CO2 22 - 32 mmol/L 25 29 26   Calcium 8.9 - 10.3 mg/dL 7.8(L) 9.2 9.2  Total Protein 6.5 - 8.1 g/dL 5.3(L) 7.6 7.2  Total Bilirubin 0.3 - 1.2 mg/dL 0.4 0.6 0.7  Alkaline Phos 38 - 126 U/L 46 58 69  AST 15 - 41 U/L 36 56(H) 37  ALT 0 - 44 U/L 63(H) 102(H) 63(H)    Pertinent Imagings/Other Imagings Plain films and CT images have been personally visualized and interpreted; radiology reports have been reviewed. Decision making incorporated into the Impression /  Recommendations.  DG Chest 2 View  Result Date: 02/05/2022 CLINICAL DATA:  Suspected sepsis EXAM: CHEST - 2 VIEW COMPARISON:  Report 03/25/2014 FINDINGS: The heart size and mediastinal contours are within normal limits. Both lungs are clear. The visualized skeletal structures are unremarkable. IMPRESSION: No active cardiopulmonary disease. Electronically Signed   By: Donavan Foil M.D.   On: 02/05/2022 23:49   DG Forearm Right  Result Date: 02/05/2022 CLINICAL DATA:  Infection red and swollen EXAM: RIGHT FOREARM - 2 VIEW COMPARISON:  None. FINDINGS: No fracture or malalignment. No periostitis or frank osseous destructive change. No radiopaque foreign body. No significant elbow effusion. Generalized edema within the soft tissues of the forearm. No soft tissue gas IMPRESSION: Soft tissue edema.  No acute osseous abnormality. Electronically Signed   By: Donavan Foil M.D.   On: 02/05/2022 23:50    I spent 80 minutes for this patient encounter including review of prior medical records/discussing diagnostics and treatment plan with the patient/family/coordinate care with primary/other specialits with greater than 50% of time in face to face encounter.   Electronically signed by:   Rosiland Oz, MD Infectious Disease Physician [facility] Pager: [phone]

## 2022-02-07 NOTE — Progress Notes (Signed)
Pharmacy Antibiotic Note  Belinda Wilson is a 26 y.o. female with hx IVDU and endocarditis presented to the ED on  02/05/2022 with c/o CP and right hand/forearm redness and swelling.  Blood cultures collected on 02/05/22 are positive for strep pyogenes.  She's currently on Pen G for cellulitis and bacteremia. Pharmacy has been consulted to add clindamycin for toxin inhibition.  Plan: - clindamycin 600mg   IV q8h - cont penicillin G 72M q12h continuous infusion _____________________________________  Height: 5\' 4"  (162.6 cm) Weight: 64.3 kg (141 lb 12.1 oz) IBW/kg (Calculated) : 54.7  Temp (24hrs), Avg:98.5 F (36.9 C), Min:98 F (36.7 C), Max:98.8 F (37.1 C)  Recent Labs  Lab 02/05/22 2253 02/06/22 0134 02/07/22 0702  WBC 6.2  --   --   CREATININE 0.70  --  0.44  LATICACIDVEN 2.6* 1.1  --     Estimated Creatinine Clearance: 93.6 mL/min (by C-G formula based on SCr of 0.44 mg/dL).    No Known Allergies   Thank you for allowing pharmacy to be a part of this patients care.  04/06/22 02/07/2022 11:48 AM

## 2022-02-08 ENCOUNTER — Inpatient Hospital Stay (HOSPITAL_COMMUNITY): Payer: Medicaid Other

## 2022-02-08 DIAGNOSIS — R7881 Bacteremia: Secondary | ICD-10-CM

## 2022-02-08 LAB — ECHOCARDIOGRAM COMPLETE
Area-P 1/2: 4.15 cm2
Calc EF: 60.1 %
Height: 64 in
S' Lateral: 2.65 cm
Single Plane A2C EF: 57.1 %
Single Plane A4C EF: 62.6 %
Weight: 2229.29 oz

## 2022-02-08 MED ORDER — METHADONE HCL 10 MG PO TABS
10.0000 mg | ORAL_TABLET | Freq: Once | ORAL | Status: AC
  Administered 2022-02-08: 10 mg via ORAL
  Filled 2022-02-08: qty 1

## 2022-02-08 MED ORDER — METHADONE HCL 10 MG PO TABS
10.0000 mg | ORAL_TABLET | Freq: Four times a day (QID) | ORAL | Status: AC | PRN
  Administered 2022-02-08 – 2022-02-09 (×3): 10 mg via ORAL
  Filled 2022-02-08 (×3): qty 1

## 2022-02-08 NOTE — Progress Notes (Signed)
MD contact regarding patient withdrawal symptoms. Patient is anxious, jittery, complains of abdominal pain and diarrhea, and is clammy. Methadone administered, but patient states it has not been very helpful. Awaiting response or new orders if appropriate.

## 2022-02-08 NOTE — Progress Notes (Signed)
Progress Note    Belinda Wilson  [id number] DOB: [birth year]  DOA: 02/05/2022 PCP: Patient, No Pcp Per (Inactive)      Brief Narrative:    Medical records reviewed and are as summarized below:  Belinda Wilson is a 26 y.o. female with medical history significant for IV drug use, endocarditis 2020, who presented to the hospital because of painful swelling and redness of her right forearm.  She was admitted to the hospital for sepsis secondary to cellulitis of the right upper extremity.  Blood cultures were supposed to for strep pyogenes consistent with strep pyogenes bacteremia.  She was treated with IV antibiotics and analgesics.    Assessment/Plan:   Principal Problem:   Sepsis ([place]) Active Problems:   Cellulitis   Streptococcal bacteremia   IVDA (intravenous drug abuse) complicating pregnancy ([place])   IVDU (intravenous drug user)   Body mass index is 23.92 kg/m.   Sepsis secondary to right upper extremity cellulitis, strep pyogenes bacteremia: 2D echo is pending.  Continue IV antibiotics.  Continue analgesics as needed for pain.  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 Yes; Fluid consistency: Thin  Diet effective now                    Consultants: Infectious disease  Procedures: None    Medications:    sodium chloride flush  3 mL Intravenous Q12H   Continuous Infusions:  sodium chloride     clindamycin (CLEOCIN) IV 600 mg (02/08/22 0523)   penicillin g continuous IV infusion 12 Million Units (02/08/22 1120)     Anti-infectives (From admission, onward)    Start     Dose/Rate Route Frequency Ordered Stop   02/07/22 1300  clindamycin (CLEOCIN) IVPB 600 mg        600 mg 100 mL/hr over 30 Minutes Intravenous Every 8 hours 02/07/22 1205     02/06/22 2215  penicillin G potassium 12 Million Units in dextrose 5  % 500 mL continuous infusion        12 Million Units 41.7 mL/hr over 12 Hours Intravenous 2 times daily 02/06/22 2122     02/06/22 1830  vancomycin (VANCOREADY) IVPB 500 mg/100 mL  Status:  Discontinued        500 mg 100 mL/hr over 60 Minutes Intravenous Every 8 hours 02/06/22 1027 02/06/22 2122   02/06/22 1715  ceFEPIme (MAXIPIME) 2 g in sodium chloride 0.9 % 100 mL IVPB  Status:  Discontinued        2 g 200 mL/hr over 30 Minutes Intravenous Every 24 hours 02/06/22 1627 02/06/22 1629   02/06/22 1100  vancomycin (VANCOREADY) IVPB 750 mg/150 mL  Status:  Discontinued        750 mg 150 mL/hr over 60 Minutes Intravenous Every 12 hours 02/06/22 0015 02/06/22 1025   02/06/22 1030  vancomycin (VANCOCIN) 500 mg in sodium chloride 0.9 % 100 mL IVPB        500 mg 100 mL/hr over 60 Minutes Intravenous  Once 02/06/22 1027 02/06/22 1231   02/06/22 1029  vancomycin (VANCOCIN) 500 MG powder       Note to Pharmacy: Cyndia Diver R: cabinet override      02/06/22 1029 02/06/22 2244   02/06/22 0600  ceFEPIme (MAXIPIME) 2 g in sodium chloride 0.9 % 100  mL IVPB  Status:  Discontinued        2 g 200 mL/hr over 30 Minutes Intravenous Every 8 hours 02/06/22 0015 02/06/22 2122   02/05/22 2315  ceFEPIme (MAXIPIME) 2 g in sodium chloride 0.9 % 100 mL IVPB        2 g 200 mL/hr over 30 Minutes Intravenous  Once 02/05/22 2308 02/06/22 0038   02/05/22 2315  metroNIDAZOLE (FLAGYL) IVPB 500 mg        500 mg 100 mL/hr over 60 Minutes Intravenous  Once 02/05/22 2308 02/06/22 0038   02/05/22 2315  vancomycin (VANCOCIN) IVPB 1000 mg/200 mL premix        1,000 mg 200 mL/hr over 60 Minutes Intravenous  Once 02/05/22 2308 02/06/22 0038              Family Communication/Anticipated D/C date and plan/Code Status   DVT prophylaxis: SCDs Start: 02/06/22 1628     Code Status: Full Code  Family Communication: None Disposition Plan: Plan to discharge home when medically stable   Status is:  Inpatient Remains inpatient appropriate because: IV antibiotics for strep bacteremia and right upper extremity cellulitis               Subjective:   Interval events noted.  She has generalized pain and swelling in the right forearm but she thinks it's improving.  She complains of abdominal cramps and requests methadone for opioid withdrawal  Objective:    Vitals:   02/07/22 2014 02/08/22 0428 02/08/22 0431 02/08/22 1256  BP: (!) 105/57 (!) 107/59  (!) 98/56  Pulse: 93 98  87  Resp: 15 15  20   Temp: 98.6 F (37 C) 98.7 F (37.1 C)  98 F (36.7 C)  TempSrc: Oral Oral  Oral  SpO2: 99% 92%  98%  Weight:   63.2 kg   Height:       No data found.   Intake/Output Summary (Last 24 hours) at 02/08/2022 1356 Last data filed at 02/08/2022 0557 Gross per 24 hour  Intake 1456.02 ml  Output --  Net 1456.02 ml   Filed Weights   02/05/22 2217 02/06/22 1517 02/08/22 0431  Weight: 52.2 kg 64.3 kg 63.2 kg    Exam:   GEN: NAD SKIN: No rash EYES: EOMI ENT: MMM CV: RRR PULM: CTA B ABD: soft, ND, NT, +BS CNS: AAO x 3, non focal EXT: Swelling, erythema and tenderness of the right forearm        Data Reviewed:   I have personally reviewed following labs and imaging studies:  Labs: Labs show the following:   Basic Metabolic Panel: Recent Labs  Lab 02/05/22 2253 02/07/22 0702  NA 136 135  K 3.7 3.6  CL 98 103  CO2 29 25  GLUCOSE 108* 109*  BUN 12 6  CREATININE 0.70 0.44  CALCIUM 9.2 7.8*   GFR Estimated Creatinine Clearance: 93.6 mL/min (by C-G formula based on SCr of 0.44 mg/dL). Liver Function Tests: Recent Labs  Lab 02/05/22 2253 02/07/22 0702  AST 56* 36  ALT 102* 63*  ALKPHOS 58 46  BILITOT 0.6 0.4  PROT 7.6 5.3*  ALBUMIN 4.2 2.6*   No results for input(s): LIPASE, AMYLASE in the last 168 hours. No results for input(s): AMMONIA in the last 168 hours. Coagulation profile Recent Labs  Lab 02/05/22 2253  INR 1.1    CBC: Recent  Labs  Lab 02/05/22 2253  WBC 6.2  NEUTROABS 4.2  HGB 14.3  HCT 40.5  MCV 87.7  PLT 149*   Cardiac Enzymes: No results for input(s): CKTOTAL, CKMB, CKMBINDEX, TROPONINI in the last 168 hours. BNP (last 3 results) No results for input(s): PROBNP in the last 8760 hours. CBG: No results for input(s): GLUCAP in the last 168 hours. D-Dimer: No results for input(s): DDIMER in the last 72 hours. Hgb A1c: No results for input(s): HGBA1C in the last 72 hours. Lipid Profile: No results for input(s): CHOL, HDL, LDLCALC, TRIG, CHOLHDL, LDLDIRECT in the last 72 hours. Thyroid function studies: No results for input(s): TSH, T4TOTAL, T3FREE, THYROIDAB in the last 72 hours.  Invalid input(s): FREET3 Anemia work up: No results for input(s): VITAMINB12, FOLATE, FERRITIN, TIBC, IRON, RETICCTPCT in the last 72 hours. Sepsis Labs: Recent Labs  Lab 02/05/22 2253 02/06/22 0134  WBC 6.2  --   LATICACIDVEN 2.6* 1.1    Microbiology Recent Results (from the past 240 hour(s))  Culture, blood (Routine x 2)     Status: Abnormal (Preliminary result)   Collection Time: 02/05/22 10:53 PM   Specimen: Vein; Blood  Result Value Ref Range Status   Specimen Description   Final    Blood Culture adequate volume BOTTLES DRAWN AEROBIC AND ANAEROBIC Performed at [facility], [place] [address], [place], [place] [address]    Special Requests   Final    BLOOD LEFT ARM UPPER Performed at [facility], [address] [place], [place] [address]    Culture  Setup Time   Final    GRAM POSITIVE COCCI IN BOTH AEROBIC AND ANAEROBIC BOTTLES CRITICAL RESULT CALLED TO, READ BACK BY AND VERIFIED WITH: PHARMD ELLEN JACKSON 02/06/22@23 :30 BY TW    Culture (A)  Final    STREPTOCOCCUS PYOGENES SUSCEPTIBILITIES PERFORMED ON PREVIOUS CULTURE WITHIN THE LAST 5 DAYS. Performed at [facility], [place] [address], [place], [place] [address]    Report Status PENDING  Incomplete  Blood Culture ID  Panel (Reflexed)     Status: Abnormal   Collection Time: 02/05/22 10:53 PM  Result Value Ref Range Status   Enterococcus faecalis NOT DETECTED NOT DETECTED Final   Enterococcus Faecium NOT DETECTED NOT DETECTED Final   Listeria monocytogenes NOT DETECTED NOT DETECTED Final   Staphylococcus species NOT DETECTED NOT DETECTED Final   Staphylococcus aureus (BCID) NOT DETECTED NOT DETECTED Final   Staphylococcus epidermidis NOT DETECTED NOT DETECTED Final   Staphylococcus lugdunensis NOT DETECTED NOT DETECTED Final   Streptococcus species DETECTED (A) NOT DETECTED Final    Comment: CRITICAL RESULT CALLED TO, READ BACK BY AND VERIFIED WITH: PHARMD ELLEN JACKSON 02/06/22@23 :30 BY TW    Streptococcus agalactiae NOT DETECTED NOT DETECTED Final   Streptococcus pneumoniae NOT DETECTED NOT DETECTED Final   Streptococcus pyogenes DETECTED (A) NOT DETECTED Final    Comment: CRITICAL RESULT CALLED TO, READ BACK BY AND VERIFIED WITH: PHARMD ELLEN JACKSON 02/06/22@23 :30 BY TW    A.calcoaceticus-baumannii NOT DETECTED NOT DETECTED Final   Bacteroides fragilis NOT DETECTED NOT DETECTED Final   Enterobacterales NOT DETECTED NOT DETECTED Final   Enterobacter cloacae complex NOT DETECTED NOT DETECTED Final   Escherichia coli NOT DETECTED NOT DETECTED Final   Klebsiella aerogenes NOT DETECTED NOT DETECTED Final   Klebsiella oxytoca NOT DETECTED NOT DETECTED Final   Klebsiella pneumoniae NOT DETECTED NOT DETECTED Final   Proteus species NOT DETECTED NOT DETECTED Final   Salmonella species NOT DETECTED NOT DETECTED Final   Serratia marcescens NOT DETECTED NOT DETECTED Final   Haemophilus influenzae NOT DETECTED  NOT DETECTED Final   Neisseria meningitidis NOT DETECTED NOT DETECTED Final   Pseudomonas aeruginosa NOT DETECTED NOT DETECTED Final   Stenotrophomonas maltophilia NOT DETECTED NOT DETECTED Final   Candida albicans NOT DETECTED NOT DETECTED Final   Candida auris NOT DETECTED NOT DETECTED Final    Candida glabrata NOT DETECTED NOT DETECTED Final   Candida krusei NOT DETECTED NOT DETECTED Final   Candida parapsilosis NOT DETECTED NOT DETECTED Final   Candida tropicalis NOT DETECTED NOT DETECTED Final   Cryptococcus neoformans/gattii NOT DETECTED NOT DETECTED Final    Comment: Performed at [facility], [address], [place], [place] [address]  Resp Panel by RT-PCR (Flu A&B, Covid) Nasopharyngeal Swab     Status: None   Collection Time: 02/05/22 11:08 PM   Specimen: Nasopharyngeal Swab; Nasopharyngeal(NP) swabs in vial transport medium  Result Value Ref Range Status   SARS Coronavirus 2 by RT PCR NEGATIVE NEGATIVE Final    Comment: (NOTE) SARS-CoV-2 target nucleic acids are NOT DETECTED.  The SARS-CoV-2 RNA is generally detectable in upper respiratory specimens during the acute phase of infection. The lowest concentration of SARS-CoV-2 viral copies this assay can detect is 138 copies/mL. A negative result does not preclude SARS-Cov-2 infection and should not be used as the sole basis for treatment or other patient management decisions. A negative result may occur with  improper specimen collection/handling, submission of specimen other than nasopharyngeal swab, presence of viral mutation(s) within the areas targeted by this assay, and inadequate number of viral copies(<138 copies/mL). A negative result must be combined with clinical observations, patient history, and epidemiological information. The expected result is Negative.  Fact Sheet for Patients:  BloggerCourse.com  Fact Sheet for Healthcare Providers:  SeriousBroker.it  This test is no t yet approved or cleared by the [place] FDA and  has been authorized for detection and/or diagnosis of SARS-CoV-2 by FDA under an Emergency Use Authorization (EUA). This EUA will remain  in effect (meaning this test can be used) for the duration of the COVID-19 declaration  under Section 564(b)(1) of the Act, 21 U.S.C.section 360bbb-3(b)(1), unless the authorization is terminated  or revoked sooner.       Influenza A by PCR NEGATIVE NEGATIVE Final   Influenza B by PCR NEGATIVE NEGATIVE Final    Comment: (NOTE) The Xpert Xpress SARS-CoV-2/FLU/RSV plus assay is intended as an aid in the diagnosis of influenza from Nasopharyngeal swab specimens and should not be used as a sole basis for treatment. Nasal washings and aspirates are unacceptable for Xpert Xpress SARS-CoV-2/FLU/RSV testing.  Fact Sheet for Patients: BloggerCourse.com  Fact Sheet for Healthcare Providers: SeriousBroker.it  This test is not yet approved or cleared by the [place] FDA and has been authorized for detection and/or diagnosis of SARS-CoV-2 by FDA under an Emergency Use Authorization (EUA). This EUA will remain in effect (meaning this test can be used) for the duration of the COVID-19 declaration under Section 564(b)(1) of the Act, 21 U.S.C. section 360bbb-3(b)(1), unless the authorization is terminated or revoked.  Performed at [facility], [address] [place], [place] [address]   Culture, blood (Routine x 2)     Status: Abnormal (Preliminary result)   Collection Time: 02/05/22 11:20 PM   Specimen: BLOOD  Result Value Ref Range Status   Specimen Description   Final    BLOOD Blood Culture adequate volume Performed at [facility], [address], [place], [place] [address]    Special Requests  Final    BLOOD LEFT HAND BOTTLES DRAWN AEROBIC AND ANAEROBIC   Culture  Setup Time   Final    GRAM POSITIVE COCCI IN CHAINS IN PAIRS IN BOTH AEROBIC AND ANAEROBIC BOTTLES CRITICAL VALUE NOTED.  VALUE IS CONSISTENT WITH PREVIOUSLY REPORTED AND CALLED VALUE.    Culture (A)  Final    STREPTOCOCCUS PYOGENES CULTURE REINCUBATED FOR BETTER GROWTH Performed at [facility],  [place] [address], [place], [place] [address]    Report Status PENDING  Incomplete   Organism ID, Bacteria STREPTOCOCCUS PYOGENES  Final      Susceptibility   Streptococcus pyogenes - MIC*    PENICILLIN <=0.06 SENSITIVE Sensitive     CEFTRIAXONE <=0.12 SENSITIVE Sensitive     ERYTHROMYCIN <=0.12 SENSITIVE Sensitive     LEVOFLOXACIN 0.5 SENSITIVE Sensitive     VANCOMYCIN <=0.12 SENSITIVE Sensitive     * STREPTOCOCCUS PYOGENES  Blood Culture ID Panel (Reflexed)     Status: Abnormal   Collection Time: 02/05/22 11:20 PM  Result Value Ref Range Status   Enterococcus faecalis NOT DETECTED NOT DETECTED Final   Enterococcus Faecium NOT DETECTED NOT DETECTED Final   Listeria monocytogenes NOT DETECTED NOT DETECTED Final   Staphylococcus species NOT DETECTED NOT DETECTED Final   Staphylococcus aureus (BCID) NOT DETECTED NOT DETECTED Final   Staphylococcus epidermidis NOT DETECTED NOT DETECTED Final   Staphylococcus lugdunensis NOT DETECTED NOT DETECTED Final   Streptococcus species DETECTED (A) NOT DETECTED Final    Comment: CRITICAL RESULT CALLED TO, READ BACK BY AND VERIFIED WITH: D WOFFORD PHARMD @2004  02/06/22 EB    Streptococcus agalactiae NOT DETECTED NOT DETECTED Final   Streptococcus pneumoniae NOT DETECTED NOT DETECTED Final   Streptococcus pyogenes DETECTED (A) NOT DETECTED Final    Comment: CRITICAL RESULT CALLED TO, READ BACK BY AND VERIFIED WITH: D WOFFORD PHARMD @2004  02/06/22 EB    A.calcoaceticus-baumannii NOT DETECTED NOT DETECTED Final   Bacteroides fragilis NOT DETECTED NOT DETECTED Final   Enterobacterales NOT DETECTED NOT DETECTED Final   Enterobacter cloacae complex NOT DETECTED NOT DETECTED Final   Escherichia coli NOT DETECTED NOT DETECTED Final   Klebsiella aerogenes NOT DETECTED NOT DETECTED Final   Klebsiella oxytoca NOT DETECTED NOT DETECTED Final   Klebsiella pneumoniae NOT DETECTED NOT DETECTED Final   Proteus species NOT DETECTED NOT DETECTED Final   Salmonella  species NOT DETECTED NOT DETECTED Final   Serratia marcescens NOT DETECTED NOT DETECTED Final   Haemophilus influenzae NOT DETECTED NOT DETECTED Final   Neisseria meningitidis NOT DETECTED NOT DETECTED Final   Pseudomonas aeruginosa NOT DETECTED NOT DETECTED Final   Stenotrophomonas maltophilia NOT DETECTED NOT DETECTED Final   Candida albicans NOT DETECTED NOT DETECTED Final   Candida auris NOT DETECTED NOT DETECTED Final   Candida glabrata NOT DETECTED NOT DETECTED Final   Candida krusei NOT DETECTED NOT DETECTED Final   Candida parapsilosis NOT DETECTED NOT DETECTED Final   Candida tropicalis NOT DETECTED NOT DETECTED Final   Cryptococcus neoformans/gattii NOT DETECTED NOT DETECTED Final    Comment: Performed at [facility], [address] [place], [place] [address]  Culture, blood (routine x 2)     Status: None (Preliminary result)   Collection Time: 02/07/22 12:27 PM   Specimen: BLOOD  Result Value Ref Range Status   Specimen Description   Final    BLOOD RIGHT ANTECUBITAL Performed at [facility] [address] [place], [place] [address]    Special Requests  Final    BOTTLES DRAWN AEROBIC ONLY Blood Culture results may not be optimal due to an inadequate volume of blood received in culture bottles Performed at [facility], [place] [address], [place], [place] [address]    Culture   Final    NO GROWTH < 24 HOURS Performed at [facility] [address], [place], [place] [address]    Report Status PENDING  Incomplete    Procedures and diagnostic studies:  No results found.             LOS: 2 days   Damonie Furney  [facility]   Pager on [email or web address]. If 7PM-7AM, please contact night-coverage at [email or web address]     02/08/2022, 1:56 PM

## 2022-02-08 NOTE — Progress Notes (Signed)
°  Echocardiogram 2D Echocardiogram has been performed.  Belinda Wilson 02/08/2022, 2:27 PM

## 2022-02-08 NOTE — Plan of Care (Signed)
  Problem: Health Behavior/Discharge Planning: Goal: Ability to manage health-related needs will improve Outcome: Progressing   Problem: Clinical Measurements: Goal: Will remain free from infection Outcome: Progressing   Problem: Activity: Goal: Risk for activity intolerance will decrease Outcome: Progressing   Problem: Pain Managment: Goal: General experience of comfort will improve Outcome: Progressing   

## 2022-02-09 ENCOUNTER — Other Ambulatory Visit (HOSPITAL_COMMUNITY): Payer: Self-pay

## 2022-02-09 LAB — CULTURE, BLOOD (ROUTINE X 2)
Specimen Description: ADEQUATE
Specimen Description: ADEQUATE

## 2022-02-09 MED ORDER — METHADONE HCL 10 MG PO TABS: 20.0000 mg | ORAL_TABLET | Freq: Four times a day (QID) | ORAL | Status: DC | PRN

## 2022-02-09 MED ORDER — BUPRENORPHINE HCL-NALOXONE HCL 2-0.5 MG SL SUBL
1.0000 | SUBLINGUAL_TABLET | Freq: Every day | SUBLINGUAL | Status: DC
  Filled 2022-02-09: qty 1

## 2022-02-09 MED ORDER — METHADONE HCL 10 MG PO TABS
10.0000 mg | ORAL_TABLET | Freq: Once | ORAL | Status: AC
Start: 2022-02-09 — End: 2022-02-09
  Administered 2022-02-09: 10 mg via ORAL
  Filled 2022-02-09: qty 1

## 2022-02-09 MED ORDER — AMOXICILLIN 500 MG PO CAPS
500.0000 mg | ORAL_CAPSULE | Freq: Three times a day (TID) | ORAL | 0 refills | Status: AC
Start: 2022-02-09 — End: 2022-02-16
  Filled 2022-02-09 – 2022-02-10 (×2): qty 21, 7d supply, fill #0

## 2022-02-09 NOTE — TOC Progression Note (Signed)
Transition of Care [facility]) - Progression Note    Patient Details  Name: Belinda Wilson MRN: [id number] Date of Birth: 25-Oct-1996  Transition of [facility]) CM/SW Contact  Geni Bers, RN Phone Number: 02/09/2022, 10:52 AM  Clinical Narrative:     TOC reviewed chart.  Expected Discharge Plan: Home/Self Care Barriers to Discharge: No Barriers Identified  Expected Discharge Plan and Services Expected Discharge Plan: Home/Self Care       Living arrangements for the past 2 months: Single Family Home                                       Social Determinants of Health (SDOH) Interventions    Readmission Risk Interventions No flowsheet data found.

## 2022-02-09 NOTE — Progress Notes (Signed)
Phlebotomy unable to obtain blood samples necessary for labs ordered. Patient is an extremely difficult stick. MD notified.

## 2022-02-09 NOTE — Progress Notes (Deleted)
Progress Note    Belinda Wilson  IHK:[id number] DOB: 06/27/1996  DOA: 02/05/2022 PCP: Patient, No Pcp Per (Inactive)      Brief Narrative:    Medical records reviewed and are as summarized below:  Belinda Wilson is a 26 y.o. female with medical history significant for IV drug use, endocarditis 2020, who presented to the hospital because of painful swelling and redness of her right forearm.  She was admitted to the hospital for sepsis secondary to cellulitis of the right upper extremity.  Blood cultures were supposed to for strep pyogenes consistent with strep pyogenes bacteremia.  She was treated with IV antibiotics and analgesics.    Assessment/Plan:   Principal Problem:   Sepsis (HCC) Active Problems:   Cellulitis   Streptococcal bacteremia   IVDA (intravenous drug abuse) complicating pregnancy (HCC)   IVDU (intravenous drug user)   Body mass index is 23.95 kg/m.   Sepsis secondary to right upper extremity cellulitis, strep pyogenes bacteremia: No evidence of vegetation/endocarditis on 2D echo.  Requested TEE from cardiology team.  Case discussed with Trish, cardiology master. She said the earliest they can do TEE will be on Friday, 02/13/2022.  Continue IV antibiotics.    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   Poor peripheral IV access: Repeat blood cultures negative thus far.  She will need a PICC line for long-term antibiotics   Diet Order             Diet regular Room service appropriate? Yes; Fluid consistency: Thin  Diet effective now                    Consultants: Infectious disease  Procedures: None    Medications:    sodium chloride flush  3 mL Intravenous Q12H   Continuous Infusions:  sodium chloride     clindamycin (CLEOCIN) IV 100 mL/hr at 02/09/22 0605   penicillin g continuous IV infusion 12 Million Units (02/08/22 2056)     Anti-infectives (From admission, onward)    Start      Dose/Rate Route Frequency Ordered Stop   02/07/22 1300  clindamycin (CLEOCIN) IVPB 600 mg        600 mg 100 mL/hr over 30 Minutes Intravenous Every 8 hours 02/07/22 1205     02/06/22 2215  penicillin G potassium 12 Million Units in dextrose 5 % 500 mL continuous infusion        12 Million Units 41.7 mL/hr over 12 Hours Intravenous 2 times daily 02/06/22 2122     02/06/22 1830  vancomycin (VANCOREADY) IVPB 500 mg/100 mL  Status:  Discontinued        500 mg 100 mL/hr over 60 Minutes Intravenous Every 8 hours 02/06/22 1027 02/06/22 2122   02/06/22 1715  ceFEPIme (MAXIPIME) 2 g in sodium chloride 0.9 % 100 mL IVPB  Status:  Discontinued        2 g 200 mL/hr over 30 Minutes Intravenous Every 24 hours 02/06/22 1627 02/06/22 1629   02/06/22 1100  vancomycin (VANCOREADY) IVPB 750 mg/150 mL  Status:  Discontinued        750 mg 150 mL/hr over 60 Minutes Intravenous Every 12 hours 02/06/22 0015 02/06/22 1025   02/06/22 1030  vancomycin (VANCOCIN) 500 mg in sodium chloride 0.9 % 100 mL IVPB        500 mg 100 mL/hr over 60 Minutes Intravenous  Once 02/06/22 1027 02/06/22 1231   02/06/22 1029  vancomycin (VANCOCIN) 500 MG powder       Note to Pharmacy: Marylene LandBarber, Margaret R: cabinet override      02/06/22 1029 02/06/22 2244   02/06/22 0600  ceFEPIme (MAXIPIME) 2 g in sodium chloride 0.9 % 100 mL IVPB  Status:  Discontinued        2 g 200 mL/hr over 30 Minutes Intravenous Every 8 hours 02/06/22 0015 02/06/22 2122   02/05/22 2315  ceFEPIme (MAXIPIME) 2 g in sodium chloride 0.9 % 100 mL IVPB        2 g 200 mL/hr over 30 Minutes Intravenous  Once 02/05/22 2308 02/06/22 0038   02/05/22 2315  metroNIDAZOLE (FLAGYL) IVPB 500 mg        500 mg 100 mL/hr over 60 Minutes Intravenous  Once 02/05/22 2308 02/06/22 0038   02/05/22 2315  vancomycin (VANCOCIN) IVPB 1000 mg/200 mL premix        1,000 mg 200 mL/hr over 60 Minutes Intravenous  Once 02/05/22 2308 02/06/22 0038              Family  Communication/Anticipated D/C date and plan/Code Status   DVT prophylaxis: SCDs Start: 02/06/22 1628     Code Status: Full Code  Family Communication: None Disposition Plan: Plan to discharge home when medically stable   Status is: Inpatient Remains inpatient appropriate because: IV antibiotics for strep bacteremia and right upper extremity cellulitis               Subjective:   Interval events noted.  She complains of abdominal cramping, diarrhea and general body pains.  She says she is withdrawing from opioids and methadone is not helping.  Swelling and tenderness in right forearm is improving  Objective:    Vitals:   02/08/22 1256 02/08/22 2007 02/09/22 0443 02/09/22 0445  BP: (!) 98/56 108/66 109/69   Pulse: 87 88 84   Resp: 20 16 15    Temp: 98 F (36.7 C) 98.2 F (36.8 C) 98.3 F (36.8 C)   TempSrc: Oral Oral Oral   SpO2: 98% 99% 98%   Weight:    63.3 kg  Height:       No data found.   Intake/Output Summary (Last 24 hours) at 02/09/2022 1044 Last data filed at 02/09/2022 [id number] Gross per 24 hour  Intake 1898.33 ml  Output --  Net 1898.33 ml   Filed Weights   02/06/22 1517 02/08/22 0431 02/09/22 0445  Weight: 64.3 kg 63.2 kg 63.3 kg    Exam:   GEN: NAD SKIN: No rash EYES: EOMI ENT: MMM CV: RRR PULM: CTA B ABD: soft, ND, NT, +BS CNS: AAO x 3, non focal EXT: Swelling, erythema and tenderness of the right forearm is improving    Data Reviewed:   I have personally reviewed following labs and imaging studies:  Labs: Labs show the following:   Basic Metabolic Panel: Recent Labs  Lab 02/05/22 2253 02/07/22 0702  NA 136 135  K 3.7 3.6  CL 98 103  CO2 29 25  GLUCOSE 108* 109*  BUN 12 6  CREATININE 0.70 0.44  CALCIUM 9.2 7.8*   GFR Estimated Creatinine Clearance: 93.6 mL/min (by C-G formula based on SCr of 0.44 mg/dL). Liver Function Tests: Recent Labs  Lab 02/05/22 2253 02/07/22 0702  AST 56* 36  ALT 102* 63*  ALKPHOS  58 46  BILITOT 0.6 0.4  PROT 7.6 5.3*  ALBUMIN 4.2 2.6*   No results for input(s): LIPASE, AMYLASE in the last 168  hours. No results for input(s): AMMONIA in the last 168 hours. Coagulation profile Recent Labs  Lab 02/05/22 2253  INR 1.1    CBC: Recent Labs  Lab 02/05/22 2253  WBC 6.2  NEUTROABS 4.2  HGB 14.3  HCT 40.5  MCV 87.7  PLT 149*   Cardiac Enzymes: No results for input(s): CKTOTAL, CKMB, CKMBINDEX, TROPONINI in the last 168 hours. BNP (last 3 results) No results for input(s): PROBNP in the last 8760 hours. CBG: No results for input(s): GLUCAP in the last 168 hours. D-Dimer: No results for input(s): DDIMER in the last 72 hours. Hgb A1c: No results for input(s): HGBA1C in the last 72 hours. Lipid Profile: No results for input(s): CHOL, HDL, LDLCALC, TRIG, CHOLHDL, LDLDIRECT in the last 72 hours. Thyroid function studies: No results for input(s): TSH, T4TOTAL, T3FREE, THYROIDAB in the last 72 hours.  Invalid input(s): FREET3 Anemia work up: No results for input(s): VITAMINB12, FOLATE, FERRITIN, TIBC, IRON, RETICCTPCT in the last 72 hours. Sepsis Labs: Recent Labs  Lab 02/05/22 2253 02/06/22 0134  WBC 6.2  --   LATICACIDVEN 2.6* 1.1    Microbiology Recent Results (from the past 240 hour(s))  Culture, blood (Routine x 2)     Status: Abnormal (Preliminary result)   Collection Time: 02/05/22 10:53 PM   Specimen: Vein; Blood  Result Value Ref Range Status   Specimen Description   Final    Blood Culture adequate volume BOTTLES DRAWN AEROBIC AND ANAEROBIC Performed at [facility], [address] [place], [place] [address]    Special Requests   Final    BLOOD LEFT ARM UPPER Performed at [facility], [address], [place], [place] [address]    Culture  Setup Time   Final    GRAM POSITIVE COCCI IN BOTH AEROBIC AND ANAEROBIC BOTTLES CRITICAL RESULT CALLED TO, READ BACK BY AND VERIFIED WITH: PHARMD ELLEN JACKSON  02/06/22@23 :30 BY TW    Culture (A)  Final    STREPTOCOCCUS PYOGENES SUSCEPTIBILITIES PERFORMED ON PREVIOUS CULTURE WITHIN THE LAST 5 DAYS. Performed at [facility], [address], [place], [place] [address]    Report Status PENDING  Incomplete  Blood Culture ID Panel (Reflexed)     Status: Abnormal   Collection Time: 02/05/22 10:53 PM  Result Value Ref Range Status   Enterococcus faecalis NOT DETECTED NOT DETECTED Final   Enterococcus Faecium NOT DETECTED NOT DETECTED Final   Listeria monocytogenes NOT DETECTED NOT DETECTED Final   Staphylococcus species NOT DETECTED NOT DETECTED Final   Staphylococcus aureus (BCID) NOT DETECTED NOT DETECTED Final   Staphylococcus epidermidis NOT DETECTED NOT DETECTED Final   Staphylococcus lugdunensis NOT DETECTED NOT DETECTED Final   Streptococcus species DETECTED (A) NOT DETECTED Final    Comment: CRITICAL RESULT CALLED TO, READ BACK BY AND VERIFIED WITH: PHARMD ELLEN JACKSON 02/06/22@23 :30 BY TW    Streptococcus agalactiae NOT DETECTED NOT DETECTED Final   Streptococcus pneumoniae NOT DETECTED NOT DETECTED Final   Streptococcus pyogenes DETECTED (A) NOT DETECTED Final    Comment: CRITICAL RESULT CALLED TO, READ BACK BY AND VERIFIED WITH: PHARMD ELLEN JACKSON 02/06/22@23 :30 BY TW    A.calcoaceticus-baumannii NOT DETECTED NOT DETECTED Final   Bacteroides fragilis NOT DETECTED NOT DETECTED Final   Enterobacterales NOT DETECTED NOT DETECTED Final   Enterobacter cloacae complex NOT DETECTED NOT DETECTED Final   Escherichia coli NOT DETECTED NOT DETECTED Final   Klebsiella aerogenes NOT DETECTED NOT DETECTED Final   Klebsiella oxytoca NOT DETECTED  NOT DETECTED Final   Klebsiella pneumoniae NOT DETECTED NOT DETECTED Final   Proteus species NOT DETECTED NOT DETECTED Final   Salmonella species NOT DETECTED NOT DETECTED Final   Serratia marcescens NOT DETECTED NOT DETECTED Final   Haemophilus influenzae NOT DETECTED NOT DETECTED Final    Neisseria meningitidis NOT DETECTED NOT DETECTED Final   Pseudomonas aeruginosa NOT DETECTED NOT DETECTED Final   Stenotrophomonas maltophilia NOT DETECTED NOT DETECTED Final   Candida albicans NOT DETECTED NOT DETECTED Final   Candida auris NOT DETECTED NOT DETECTED Final   Candida glabrata NOT DETECTED NOT DETECTED Final   Candida krusei NOT DETECTED NOT DETECTED Final   Candida parapsilosis NOT DETECTED NOT DETECTED Final   Candida tropicalis NOT DETECTED NOT DETECTED Final   Cryptococcus neoformans/gattii NOT DETECTED NOT DETECTED Final    Comment: Performed at [facility], [address] [place], [place] [address]  Resp Panel by RT-PCR (Flu A&B, Covid) Nasopharyngeal Swab     Status: None   Collection Time: 02/05/22 11:08 PM   Specimen: Nasopharyngeal Swab; Nasopharyngeal(NP) swabs in vial transport medium  Result Value Ref Range Status   SARS Coronavirus 2 by RT PCR NEGATIVE NEGATIVE Final    Comment: (NOTE) SARS-CoV-2 target nucleic acids are NOT DETECTED.  The SARS-CoV-2 RNA is generally detectable in upper respiratory specimens during the acute phase of infection. The lowest concentration of SARS-CoV-2 viral copies this assay can detect is 138 copies/mL. A negative result does not preclude SARS-Cov-2 infection and should not be used as the sole basis for treatment or other patient management decisions. A negative result may occur with  improper specimen collection/handling, submission of specimen other than nasopharyngeal swab, presence of viral mutation(s) within the areas targeted by this assay, and inadequate number of viral copies(<138 copies/mL). A negative result must be combined with clinical observations, patient history, and epidemiological information. The expected result is Negative.  Fact Sheet for Patients:  BloggerCourse.comhttps://www.fda.gov/media/152166/download  Fact Sheet for Healthcare Providers:  SeriousBroker.ithttps://www.fda.gov/media/152162/download  This test is no t  yet approved or cleared by the [place] FDA and  has been authorized for detection and/or diagnosis of SARS-CoV-2 by FDA under an Emergency Use Authorization (EUA). This EUA will remain  in effect (meaning this test can be used) for the duration of the COVID-19 declaration under Section 564(b)(1) of the Act, 21 U.S.C.section 360bbb-3(b)(1), unless the authorization is terminated  or revoked sooner.       Influenza A by PCR NEGATIVE NEGATIVE Final   Influenza B by PCR NEGATIVE NEGATIVE Final    Comment: (NOTE) The Xpert Xpress SARS-CoV-2/FLU/RSV plus assay is intended as an aid in the diagnosis of influenza from Nasopharyngeal swab specimens and should not be used as a sole basis for treatment. Nasal washings and aspirates are unacceptable for Xpert Xpress SARS-CoV-2/FLU/RSV testing.  Fact Sheet for Patients: BloggerCourse.comhttps://www.fda.gov/media/152166/download  Fact Sheet for Healthcare Providers: SeriousBroker.ithttps://www.fda.gov/media/152162/download  This test is not yet approved or cleared by the [place] FDA and has been authorized for detection and/or diagnosis of SARS-CoV-2 by FDA under an Emergency Use Authorization (EUA). This EUA will remain in effect (meaning this test can be used) for the duration of the COVID-19 declaration under Section 564(b)(1) of the Act, 21 U.S.C. section 360bbb-3(b)(1), unless the authorization is terminated or revoked.  Performed at [facility], [address], [place], [place] [address]   Culture, blood (Routine x 2)     Status: Abnormal (Preliminary result)   Collection Time: 02/05/22 11:20  PM   Specimen: BLOOD  Result Value Ref Range Status   Specimen Description   Final    BLOOD Blood Culture adequate volume Performed at [facility], [address] [place], [place] [address]    Special Requests   Final    BLOOD LEFT HAND BOTTLES DRAWN AEROBIC AND ANAEROBIC   Culture  Setup Time   Final    GRAM POSITIVE  COCCI IN CHAINS IN PAIRS IN BOTH AEROBIC AND ANAEROBIC BOTTLES CRITICAL VALUE NOTED.  VALUE IS CONSISTENT WITH PREVIOUSLY REPORTED AND CALLED VALUE. Performed at [facility], [address], [place], [place] [address]    Culture STREPTOCOCCUS PYOGENES (A)  Final   Report Status PENDING  Incomplete   Organism ID, Bacteria STREPTOCOCCUS PYOGENES  Final      Susceptibility   Streptococcus pyogenes - MIC*    PENICILLIN <=0.06 SENSITIVE Sensitive     CEFTRIAXONE <=0.12 SENSITIVE Sensitive     ERYTHROMYCIN <=0.12 SENSITIVE Sensitive     LEVOFLOXACIN 0.5 SENSITIVE Sensitive     VANCOMYCIN <=0.12 SENSITIVE Sensitive     * STREPTOCOCCUS PYOGENES  Blood Culture ID Panel (Reflexed)     Status: Abnormal   Collection Time: 02/05/22 11:20 PM  Result Value Ref Range Status   Enterococcus faecalis NOT DETECTED NOT DETECTED Final   Enterococcus Faecium NOT DETECTED NOT DETECTED Final   Listeria monocytogenes NOT DETECTED NOT DETECTED Final   Staphylococcus species NOT DETECTED NOT DETECTED Final   Staphylococcus aureus (BCID) NOT DETECTED NOT DETECTED Final   Staphylococcus epidermidis NOT DETECTED NOT DETECTED Final   Staphylococcus lugdunensis NOT DETECTED NOT DETECTED Final   Streptococcus species DETECTED (A) NOT DETECTED Final    Comment: CRITICAL RESULT CALLED TO, READ BACK BY AND VERIFIED WITH: D WOFFORD PHARMD @2004  02/06/22 EB    Streptococcus agalactiae NOT DETECTED NOT DETECTED Final   Streptococcus pneumoniae NOT DETECTED NOT DETECTED Final   Streptococcus pyogenes DETECTED (A) NOT DETECTED Final    Comment: CRITICAL RESULT CALLED TO, READ BACK BY AND VERIFIED WITH: D WOFFORD PHARMD @2004  02/06/22 EB    A.calcoaceticus-baumannii NOT DETECTED NOT DETECTED Final   Bacteroides fragilis NOT DETECTED NOT DETECTED Final   Enterobacterales NOT DETECTED NOT DETECTED Final   Enterobacter cloacae complex NOT DETECTED NOT DETECTED Final   Escherichia coli NOT DETECTED NOT DETECTED Final    Klebsiella aerogenes NOT DETECTED NOT DETECTED Final   Klebsiella oxytoca NOT DETECTED NOT DETECTED Final   Klebsiella pneumoniae NOT DETECTED NOT DETECTED Final   Proteus species NOT DETECTED NOT DETECTED Final   Salmonella species NOT DETECTED NOT DETECTED Final   Serratia marcescens NOT DETECTED NOT DETECTED Final   Haemophilus influenzae NOT DETECTED NOT DETECTED Final   Neisseria meningitidis NOT DETECTED NOT DETECTED Final   Pseudomonas aeruginosa NOT DETECTED NOT DETECTED Final   Stenotrophomonas maltophilia NOT DETECTED NOT DETECTED Final   Candida albicans NOT DETECTED NOT DETECTED Final   Candida auris NOT DETECTED NOT DETECTED Final   Candida glabrata NOT DETECTED NOT DETECTED Final   Candida krusei NOT DETECTED NOT DETECTED Final   Candida parapsilosis NOT DETECTED NOT DETECTED Final   Candida tropicalis NOT DETECTED NOT DETECTED Final   Cryptococcus neoformans/gattii NOT DETECTED NOT DETECTED Final    Comment: Performed at [facility], [address], [place], [place] [address]  Culture, blood (routine x 2)     Status: None (Preliminary result)   Collection Time: 02/07/22 12:27 PM   Specimen: BLOOD  Result Value Ref Range Status   Specimen Description   Final    BLOOD RIGHT ANTECUBITAL Performed at [facility], [address] [place], [place] [address]    Special Requests   Final    BOTTLES DRAWN AEROBIC ONLY Blood Culture results may not be optimal due to an inadequate volume of blood received in culture bottles Performed at [facility], [address], [place], [place] [address]    Culture   Final    NO GROWTH 2 DAYS Performed at [facility], [address] [place], [place] [address]    Report Status PENDING  Incomplete    Procedures and diagnostic studies:  ECHOCARDIOGRAM COMPLETE  Result Date: 02/08/2022    ECHOCARDIOGRAM REPORT   Patient Name:   Belinda Wilson Date of Exam: 02/08/2022 Medical Rec #:   [id number]        Height:       64.0 in Accession #:    [id number]       Weight:       139.3 lb Date of Birth:  November 29, 1996         BSA:          1.678 m Patient Age:    25 years         BP:           107/59 mmHg Patient Gender: F                HR:           79 bpm. Exam Location:  Inpatient Procedure: 2D Echo, 3D Echo, Cardiac Doppler and Color Doppler Indications:    Bacteremia  History:        Patient has no prior history of Echocardiogram examinations.                 Endocarditis; Signs/Symptoms:Bacteremia. IVDU.  Sonographer:    Sheralyn Boatman RDCS Referring Phys: [id number] Belinda Wilson  1. Left ventricular ejection fraction, by estimation, is 60 to 65%. Left ventricular ejection fraction by 3D volume is 66 %. The left ventricle has normal function. The left ventricle has no regional wall motion abnormalities. Left ventricular diastolic  parameters were normal.  2. Right ventricular systolic function is normal. The right ventricular size is normal. A Prominent moderator band of no clinical significance is visualized. There is normal pulmonary artery systolic pressure. The estimated right ventricular systolic pressure is 34.7 mmHg.  3. The mitral valve is normal in structure. Trivial mitral valve regurgitation. No evidence of mitral stenosis.  4. The aortic valve is normal in structure. Aortic valve regurgitation is not visualized. No aortic stenosis is present.  5. The inferior vena cava is normal in size with greater than 50% respiratory variability, suggesting right atrial pressure of 3 mmHg. Conclusion(s)/Recommendation(s): No evidence of valvular vegetations on this transthoracic echocardiogram. Consider a transesophageal echocardiogram to exclude infective endocarditis if clinically indicated. FINDINGS  Left Ventricle: Left ventricular ejection fraction, by estimation, is 60 to 65%. Left ventricular ejection fraction by 3D volume is 66 %. The left ventricle has normal function. The left ventricle has  no regional wall motion abnormalities. The left ventricular internal cavity size was normal in size. There is no left ventricular hypertrophy. Left ventricular diastolic parameters were normal. Normal left ventricular filling pressure. Right Ventricle: The right ventricular size is normal. No increase in right ventricular wall thickness. Right ventricular systolic function is normal. There is normal pulmonary artery systolic pressure.  The tricuspid regurgitant velocity is 2.22 m/s, and  with an assumed right atrial pressure of 15 mmHg, the estimated right ventricular systolic pressure is 34.7 mmHg. Left Atrium: Left atrial size was normal in size. Right Atrium: Right atrial size was normal in size. Pericardium: There is no evidence of pericardial effusion. Mitral Valve: The mitral valve is normal in structure. Trivial mitral valve regurgitation. No evidence of mitral valve stenosis. Tricuspid Valve: The tricuspid valve is normal in structure. Tricuspid valve regurgitation is mild . No evidence of tricuspid stenosis. Aortic Valve: The aortic valve is normal in structure. Aortic valve regurgitation is not visualized. No aortic stenosis is present. Pulmonic Valve: The pulmonic valve was normal in structure. Pulmonic valve regurgitation is not visualized. No evidence of pulmonic stenosis. Aorta: The aortic root is normal in size and structure. Venous: The inferior vena cava is normal in size with greater than 50% respiratory variability, suggesting right atrial pressure of 3 mmHg. IAS/Shunts: No atrial level shunt detected by color flow Doppler. Additional Comments: A Prominent moderator band of no clinical significance is visualized.  LEFT VENTRICLE PLAX 2D LVIDd:         4.10 cm         Diastology LVIDs:         2.65 cm         LV e' medial:    14.70 cm/s LV PW:         1.25 cm         LV E/e' medial:  7.5 LV IVS:        1.10 cm         LV e' lateral:   16.40 cm/s LVOT diam:     1.90 cm         LV E/e' lateral: 6.7 LV  SV:         66 LV SV Index:   40 LVOT Area:     2.84 cm        3D Volume EF                                LV 3D EF:    Left                                             ventricul LV Volumes (MOD)                            ar LV vol d, MOD    70.4 ml                    ejection A2C:                                        fraction LV vol d, MOD    67.7 ml                    by 3D A4C:                                        volume  is LV vol s, MOD    30.2 ml                    66 %. A2C: LV vol s, MOD    25.3 ml A4C:                           3D Volume EF: LV SV MOD A2C:   40.2 ml       3D EF:        66 % LV SV MOD A4C:   67.7 ml LV SV MOD BP:    42.0 ml RIGHT VENTRICLE             IVC RV S prime:     12.90 cm/s  IVC diam: 2.20 cm TAPSE (M-mode): 2.2 cm LEFT ATRIUM             Index        RIGHT ATRIUM           Index LA diam:        3.00 cm 1.79 cm/m   RA Area:     12.20 cm LA Vol (A2C):   17.1 ml 10.19 ml/m  RA Volume:   25.50 ml  15.20 ml/m LA Vol (A4C):   16.0 ml 9.54 ml/m LA Biplane Vol: 16.7 ml 9.95 ml/m  AORTIC VALVE LVOT Vmax:   118.00 cm/s LVOT Vmean:  80.500 cm/s LVOT VTI:    0.234 m  AORTA Ao Root diam: 2.75 cm Ao Asc diam:  2.70 cm MITRAL VALVE                TRICUSPID VALVE MV Area (PHT): 4.15 cm     TR Peak grad:   19.7 mmHg MV Decel Time: 183 msec     TR Vmax:        222.00 cm/s MV E velocity: 110.00 cm/s MV A velocity: 71.80 cm/s   SHUNTS MV E/A ratio:  1.53         Systemic VTI:  0.23 m                             Systemic Diam: 1.90 cm Armanda Magic MD Electronically signed by Armanda Magic MD Signature Date/Time: 02/08/2022/2:42:00 PM    Final                LOS: 3 days   Berenice Oehlert  [facility]   Pager on [email or web address]. If 7PM-7AM, please contact night-coverage at [email or web address]     02/09/2022, 10:44 AM

## 2022-02-09 NOTE — Progress Notes (Signed)
[facility]   Reason for visit: Follow up on soft tissue infection  Interval History: initiall blood cultures with Strep pyogenes.  Repeat set no growth to date.  No fever since her initial fever at discharge.  Much improved right arm infection.    Physical Exam: Constitutional:  Vitals:   02/08/22 2007 02/09/22 0443  BP: 108/66 109/69  Pulse: 88 84  Resp: 16 15  Temp: 98.2 F (36.8 C) 98.3 F (36.8 C)  SpO2: 99% 98%   patient appears in NAD Respiratory: Normal respiratory effort; CTA B Cardiovascular: RRR GI: soft, nt, nd MS: right arm with some scant erythema, no warmth  Review of Systems: Constitutional: negative for fevers and chills Gastrointestinal: negative for nausea  Lab Results  Component Value Date   WBC 6.2 02/05/2022   HGB 14.3 02/05/2022   HCT 40.5 02/05/2022   MCV 87.7 02/05/2022   PLT 149 (L) 02/05/2022    Lab Results  Component Value Date   CREATININE 0.44 02/07/2022   BUN 6 02/07/2022   NA 135 02/07/2022   K 3.6 02/07/2022   CL 103 02/07/2022   CO2 25 02/07/2022    Lab Results  Component Value Date   ALT 63 (H) 02/07/2022   AST 36 02/07/2022   ALKPHOS 46 02/07/2022     Microbiology: Recent Results (from the past 240 hour(s))  Culture, blood (Routine x 2)     Status: Abnormal   Collection Time: 02/05/22 10:53 PM   Specimen: Vein; Blood  Result Value Ref Range Status   Specimen Description   Final    Blood Culture adequate volume BOTTLES DRAWN AEROBIC AND ANAEROBIC Performed at [facility], [address] [place], [place] [address]    Special Requests   Final    BLOOD LEFT ARM UPPER Performed at [facility], [address], [place], [place] [address]    Culture  Setup Time   Final    GRAM POSITIVE COCCI IN BOTH AEROBIC AND ANAEROBIC BOTTLES CRITICAL RESULT CALLED TO, READ BACK BY AND VERIFIED WITH: PHARMD ELLEN JACKSON 02/06/22@23 :30 BY TW    Culture (A)  Final     STREPTOCOCCUS PYOGENES SUSCEPTIBILITIES PERFORMED ON PREVIOUS CULTURE WITHIN THE LAST 5 DAYS. HEALTH DEPARTMENT NOTIFIED Performed at [facility], [address] [place], [place] [address]    Report Status 02/09/2022 FINAL  Final  Blood Culture ID Panel (Reflexed)     Status: Abnormal   Collection Time: 02/05/22 10:53 PM  Result Value Ref Range Status   Enterococcus faecalis NOT DETECTED NOT DETECTED Final   Enterococcus Faecium NOT DETECTED NOT DETECTED Final   Listeria monocytogenes NOT DETECTED NOT DETECTED Final   Staphylococcus species NOT DETECTED NOT DETECTED Final   Staphylococcus aureus (BCID) NOT DETECTED NOT DETECTED Final   Staphylococcus epidermidis NOT DETECTED NOT DETECTED Final   Staphylococcus lugdunensis NOT DETECTED NOT DETECTED Final   Streptococcus species DETECTED (A) NOT DETECTED Final    Comment: CRITICAL RESULT CALLED TO, READ BACK BY AND VERIFIED WITH: PHARMD ELLEN JACKSON 02/06/22@23 :30 BY TW    Streptococcus agalactiae NOT DETECTED NOT DETECTED Final   Streptococcus pneumoniae NOT DETECTED NOT DETECTED Final   Streptococcus pyogenes DETECTED (A) NOT DETECTED Final    Comment: CRITICAL RESULT CALLED TO, READ BACK BY AND VERIFIED WITH: PHARMD ELLEN JACKSON 02/06/22@23 :30 BY TW    A.calcoaceticus-baumannii NOT DETECTED NOT DETECTED Final   Bacteroides fragilis NOT DETECTED NOT DETECTED Final  Enterobacterales NOT DETECTED NOT DETECTED Final   Enterobacter cloacae complex NOT DETECTED NOT DETECTED Final   Escherichia coli NOT DETECTED NOT DETECTED Final   Klebsiella aerogenes NOT DETECTED NOT DETECTED Final   Klebsiella oxytoca NOT DETECTED NOT DETECTED Final   Klebsiella pneumoniae NOT DETECTED NOT DETECTED Final   Proteus species NOT DETECTED NOT DETECTED Final   Salmonella species NOT DETECTED NOT DETECTED Final   Serratia marcescens NOT DETECTED NOT DETECTED Final   Haemophilus influenzae NOT DETECTED NOT DETECTED Final   Neisseria meningitidis  NOT DETECTED NOT DETECTED Final   Pseudomonas aeruginosa NOT DETECTED NOT DETECTED Final   Stenotrophomonas maltophilia NOT DETECTED NOT DETECTED Final   Candida albicans NOT DETECTED NOT DETECTED Final   Candida auris NOT DETECTED NOT DETECTED Final   Candida glabrata NOT DETECTED NOT DETECTED Final   Candida krusei NOT DETECTED NOT DETECTED Final   Candida parapsilosis NOT DETECTED NOT DETECTED Final   Candida tropicalis NOT DETECTED NOT DETECTED Final   Cryptococcus neoformans/gattii NOT DETECTED NOT DETECTED Final    Comment: Performed at [facility], [address], [place], [place] [address]  Resp Panel by RT-PCR (Flu A&B, Covid) Nasopharyngeal Swab     Status: None   Collection Time: 02/05/22 11:08 PM   Specimen: Nasopharyngeal Swab; Nasopharyngeal(NP) swabs in vial transport medium  Result Value Ref Range Status   SARS Coronavirus 2 by RT PCR NEGATIVE NEGATIVE Final    Comment: (NOTE) SARS-CoV-2 target nucleic acids are NOT DETECTED.  The SARS-CoV-2 RNA is generally detectable in upper respiratory specimens during the acute phase of infection. The lowest concentration of SARS-CoV-2 viral copies this assay can detect is 138 copies/mL. A negative result does not preclude SARS-Cov-2 infection and should not be used as the sole basis for treatment or other patient management decisions. A negative result may occur with  improper specimen collection/handling, submission of specimen other than nasopharyngeal swab, presence of viral mutation(s) within the areas targeted by this assay, and inadequate number of viral copies(<138 copies/mL). A negative result must be combined with clinical observations, patient history, and epidemiological information. The expected result is Negative.  Fact Sheet for Patients:  BloggerCourse.com  Fact Sheet for Healthcare Providers:  SeriousBroker.it  This test is no t yet approved or cleared  by the [place] FDA and  has been authorized for detection and/or diagnosis of SARS-CoV-2 by FDA under an Emergency Use Authorization (EUA). This EUA will remain  in effect (meaning this test can be used) for the duration of the COVID-19 declaration under Section 564(b)(1) of the Act, 21 U.S.C.section 360bbb-3(b)(1), unless the authorization is terminated  or revoked sooner.       Influenza A by PCR NEGATIVE NEGATIVE Final   Influenza B by PCR NEGATIVE NEGATIVE Final    Comment: (NOTE) The Xpert Xpress SARS-CoV-2/FLU/RSV plus assay is intended as an aid in the diagnosis of influenza from Nasopharyngeal swab specimens and should not be used as a sole basis for treatment. Nasal washings and aspirates are unacceptable for Xpert Xpress SARS-CoV-2/FLU/RSV testing.  Fact Sheet for Patients: BloggerCourse.com  Fact Sheet for Healthcare Providers: SeriousBroker.it  This test is not yet approved or cleared by the [place] FDA and has been authorized for detection and/or diagnosis of SARS-CoV-2 by FDA under an Emergency Use Authorization (EUA). This EUA will remain in effect (meaning this test can be used) for the duration of the COVID-19 declaration under Section 564(b)(1) of the Act, 21 U.S.C. section 360bbb-3(b)(1), unless  the authorization is terminated or revoked.  Performed at [facility], [address], [place], [place] [address]   Culture, blood (Routine x 2)     Status: Abnormal   Collection Time: 02/05/22 11:20 PM   Specimen: BLOOD  Result Value Ref Range Status   Specimen Description   Final    BLOOD Blood Culture adequate volume Performed at [facility], [address] [place], [place] [address]    Special Requests   Final    BLOOD LEFT HAND BOTTLES DRAWN AEROBIC AND ANAEROBIC   Culture  Setup Time   Final    GRAM POSITIVE COCCI IN CHAINS IN PAIRS IN BOTH AEROBIC  AND ANAEROBIC BOTTLES CRITICAL VALUE NOTED.  VALUE IS CONSISTENT WITH PREVIOUSLY REPORTED AND CALLED VALUE.    Culture (A)  Final    STREPTOCOCCUS PYOGENES HEALTH DEPARTMENT NOTIFIED Performed at [facility], [address] [place], [place] [address]    Report Status 02/09/2022 FINAL  Final   Organism ID, Bacteria STREPTOCOCCUS PYOGENES  Final      Susceptibility   Streptococcus pyogenes - MIC*    PENICILLIN <=0.06 SENSITIVE Sensitive     CEFTRIAXONE <=0.12 SENSITIVE Sensitive     ERYTHROMYCIN <=0.12 SENSITIVE Sensitive     LEVOFLOXACIN 0.5 SENSITIVE Sensitive     VANCOMYCIN <=0.12 SENSITIVE Sensitive     * STREPTOCOCCUS PYOGENES  Blood Culture ID Panel (Reflexed)     Status: Abnormal   Collection Time: 02/05/22 11:20 PM  Result Value Ref Range Status   Enterococcus faecalis NOT DETECTED NOT DETECTED Final   Enterococcus Faecium NOT DETECTED NOT DETECTED Final   Listeria monocytogenes NOT DETECTED NOT DETECTED Final   Staphylococcus species NOT DETECTED NOT DETECTED Final   Staphylococcus aureus (BCID) NOT DETECTED NOT DETECTED Final   Staphylococcus epidermidis NOT DETECTED NOT DETECTED Final   Staphylococcus lugdunensis NOT DETECTED NOT DETECTED Final   Streptococcus species DETECTED (A) NOT DETECTED Final    Comment: CRITICAL RESULT CALLED TO, READ BACK BY AND VERIFIED WITH: D WOFFORD PHARMD @2004  02/06/22 EB    Streptococcus agalactiae NOT DETECTED NOT DETECTED Final   Streptococcus pneumoniae NOT DETECTED NOT DETECTED Final   Streptococcus pyogenes DETECTED (A) NOT DETECTED Final    Comment: CRITICAL RESULT CALLED TO, READ BACK BY AND VERIFIED WITH: D WOFFORD PHARMD @2004  02/06/22 EB    A.calcoaceticus-baumannii NOT DETECTED NOT DETECTED Final   Bacteroides fragilis NOT DETECTED NOT DETECTED Final   Enterobacterales NOT DETECTED NOT DETECTED Final   Enterobacter cloacae complex NOT DETECTED NOT DETECTED Final   Escherichia coli NOT DETECTED NOT DETECTED Final    Klebsiella aerogenes NOT DETECTED NOT DETECTED Final   Klebsiella oxytoca NOT DETECTED NOT DETECTED Final   Klebsiella pneumoniae NOT DETECTED NOT DETECTED Final   Proteus species NOT DETECTED NOT DETECTED Final   Salmonella species NOT DETECTED NOT DETECTED Final   Serratia marcescens NOT DETECTED NOT DETECTED Final   Haemophilus influenzae NOT DETECTED NOT DETECTED Final   Neisseria meningitidis NOT DETECTED NOT DETECTED Final   Pseudomonas aeruginosa NOT DETECTED NOT DETECTED Final   Stenotrophomonas maltophilia NOT DETECTED NOT DETECTED Final   Candida albicans NOT DETECTED NOT DETECTED Final   Candida auris NOT DETECTED NOT DETECTED Final   Candida glabrata NOT DETECTED NOT DETECTED Final   Candida krusei NOT DETECTED NOT DETECTED Final   Candida parapsilosis NOT DETECTED NOT DETECTED Final   Candida tropicalis NOT DETECTED NOT DETECTED Final   Cryptococcus neoformans/gattii NOT DETECTED  NOT DETECTED Final    Comment: Performed at [facility], [address] [place], [place] [address]  Culture, blood (routine x 2)     Status: None (Preliminary result)   Collection Time: 02/07/22 12:27 PM   Specimen: BLOOD  Result Value Ref Range Status   Specimen Description   Final    BLOOD RIGHT ANTECUBITAL Performed at [facility], [address] [place], [place] [address]    Special Requests   Final    BOTTLES DRAWN AEROBIC ONLY Blood Culture results may not be optimal due to an inadequate volume of blood received in culture bottles Performed at [facility], [address] [place], [place] [address]    Culture   Final    NO GROWTH 2 DAYS Performed at [facility], [address] [place], [place] [address]    Report Status PENDING  Incomplete    Impression/Plan:  1. Soft tissue infection - blood culture with group A Strep and overall improving clinically.  She is on penicillin and can transition her to oral amoxicillin 500 mg three  times a day for another 7 days.   She can follow up with me after discharge.  2.  Chronic hepatitis C - previously treated with Epclusa then lost to follow up.  Was to start Vosevi for replapsed hepatitis C.  Did not appear to follow up after that.  Can consider treatment again at outpatient follow up if still active.    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     She has outpatient follow up with me on 02/27/22 at 2:30pm  I will sign off, call with questions.

## 2022-02-09 NOTE — Plan of Care (Signed)
°  Problem: Pain Managment: Goal: General experience of comfort will improve Outcome: Adequate for Discharge   Problem: Safety: Goal: Ability to remain free from injury will improve Outcome: Adequate for Discharge   Problem: Coping: Goal: Level of anxiety will decrease Outcome: Adequate for Discharge

## 2022-02-09 NOTE — Discharge Summary (Signed)
Physician Discharge Summary  Belinda Wilson [id number] DOB: [birth year] DOA: 02/05/2022  PCP: Patient, No Pcp Per (Inactive)  Admit date: 02/05/2022 Discharge date: 02/09/2022  Discharge disposition: Home   Recommendations for Outpatient Follow-Up:   Establish care with a PCP for routine health maintenance Follow-up with Dr. Linus Salmons, [place] specialist, on 02/27/2022   Discharge Diagnosis:   Principal Problem:   Sepsis ([place]) Active Problems:   Cellulitis   Streptococcal bacteremia   IVDA (intravenous drug abuse) complicating pregnancy ([place])   IVDU (intravenous drug user)    Discharge Condition: Stable.  Diet recommendation:  Diet Order             Diet general           Diet regular Room service appropriate? Yes; Fluid consistency: Thin  Diet effective now                     Code Status: Full Code     Hospital Course:   Belinda Wilson is a 26 y.o. female with medical history significant for IV drug use, endocarditis 2020, who presented to the hospital because of painful swelling and redness of her right forearm.  She was admitted to the hospital for sepsis secondary to cellulitis of the right upper extremity.  Blood cultures were supposed to for strep pyogenes consistent with strep pyogenes bacteremia. She was treated with IV antibiotics and analgesics.  2D echo did not show any vegetations.  ID was consulted to assist with management.  It was recommended that patient be discharged on oral amoxicillin for 7 days.  She also required methadone for opioid withdrawal.  Her condition has improved and she is deemed stable for discharge to home today.      Medical Consultants:   Infectious disease   Discharge Exam:    Vitals:   02/08/22 1256 02/08/22 2007 02/09/22 0443 02/09/22 0445  BP: (!) 98/56 108/66 109/69   Pulse: 87 88 84   Resp: 20 16 15    Temp: 98 F (36.7 C) 98.2 F (36.8 C) 98.3 F (36.8 C)   TempSrc: Oral Oral Oral   SpO2: 98% 99% 98%    Weight:    63.3 kg  Height:         GEN: NAD SKIN: Warm and dry EYES: No pallor or icterus ENT: MMM CV: RRR PULM: CTA B ABD: soft, ND, NT, +BS CNS: AAO x 3, non focal EXT: Swelling, tenderness and erythema of right forearm is improving   The results of significant diagnostics from this hospitalization (including imaging, microbiology, ancillary and laboratory) are listed below for reference.     Procedures and Diagnostic Studies:   DG Chest 2 View  Result Date: 02/05/2022 CLINICAL DATA:  Suspected sepsis EXAM: CHEST - 2 VIEW COMPARISON:  Report 03/25/2014 FINDINGS: The heart size and mediastinal contours are within normal limits. Both lungs are clear. The visualized skeletal structures are unremarkable. IMPRESSION: No active cardiopulmonary disease. Electronically Signed   By: Donavan Foil M.D.   On: 02/05/2022 23:49   DG Forearm Right  Result Date: 02/05/2022 CLINICAL DATA:  Infection red and swollen EXAM: RIGHT FOREARM - 2 VIEW COMPARISON:  None. FINDINGS: No fracture or malalignment. No periostitis or frank osseous destructive change. No radiopaque foreign body. No significant elbow effusion. Generalized edema within the soft tissues of the forearm. No soft tissue gas IMPRESSION: Soft tissue edema.  No acute osseous abnormality. Electronically Signed   By: Madie Reno.D.  On: 02/05/2022 23:50     Labs:   Basic Metabolic Panel: Recent Labs  Lab 02/05/22 2253 02/07/22 0702  NA 136 135  K 3.7 3.6  CL 98 103  CO2 29 25  GLUCOSE 108* 109*  BUN 12 6  CREATININE 0.70 0.44  CALCIUM 9.2 7.8*   GFR Estimated Creatinine Clearance: 93.6 mL/min (by C-G formula based on SCr of 0.44 mg/dL). Liver Function Tests: Recent Labs  Lab 02/05/22 2253 02/07/22 0702  AST 56* 36  ALT 102* 63*  ALKPHOS 58 46  BILITOT 0.6 0.4  PROT 7.6 5.3*  ALBUMIN 4.2 2.6*   No results for input(s): LIPASE, AMYLASE in the last 168 hours. No results for input(s): AMMONIA in the last 168  hours. Coagulation profile Recent Labs  Lab 02/05/22 2253  INR 1.1    CBC: Recent Labs  Lab 02/05/22 2253  WBC 6.2  NEUTROABS 4.2  HGB 14.3  HCT 40.5  MCV 87.7  PLT 149*   Cardiac Enzymes: No results for input(s): CKTOTAL, CKMB, CKMBINDEX, TROPONINI in the last 168 hours. BNP: Invalid input(s): POCBNP CBG: No results for input(s): GLUCAP in the last 168 hours. D-Dimer No results for input(s): DDIMER in the last 72 hours. Hgb A1c No results for input(s): HGBA1C in the last 72 hours. Lipid Profile No results for input(s): CHOL, HDL, LDLCALC, TRIG, CHOLHDL, LDLDIRECT in the last 72 hours. Thyroid function studies No results for input(s): TSH, T4TOTAL, T3FREE, THYROIDAB in the last 72 hours.  Invalid input(s): FREET3 Anemia work up No results for input(s): VITAMINB12, FOLATE, FERRITIN, TIBC, IRON, RETICCTPCT in the last 72 hours. Microbiology Recent Results (from the past 240 hour(s))  Culture, blood (Routine x 2)     Status: Abnormal   Collection Time: 02/05/22 10:53 PM   Specimen: Vein; Blood  Result Value Ref Range Status   Specimen Description   Final    Blood Culture adequate volume BOTTLES DRAWN AEROBIC AND ANAEROBIC Performed at [facility], [place] [address] [place], [place] [address]    Special Requests   Final    BLOOD LEFT ARM UPPER Performed at [facility], [address] [place], [place] [address]    Culture  Setup Time   Final    GRAM POSITIVE COCCI IN BOTH AEROBIC AND ANAEROBIC BOTTLES CRITICAL RESULT CALLED TO, READ BACK BY AND VERIFIED WITH: PHARMD ELLEN JACKSON 02/06/22@23 :30 BY TW    Culture (A)  Final    STREPTOCOCCUS PYOGENES SUSCEPTIBILITIES PERFORMED ON PREVIOUS CULTURE WITHIN THE LAST 5 DAYS. HEALTH DEPARTMENT NOTIFIED Performed at [facility], [place] [address], [place], [place] [address]    Report Status 02/09/2022 FINAL  Final  Blood Culture ID Panel (Reflexed)     Status: Abnormal   Collection Time:  02/05/22 10:53 PM  Result Value Ref Range Status   Enterococcus faecalis NOT DETECTED NOT DETECTED Final   Enterococcus Faecium NOT DETECTED NOT DETECTED Final   Listeria monocytogenes NOT DETECTED NOT DETECTED Final   Staphylococcus species NOT DETECTED NOT DETECTED Final   Staphylococcus aureus (BCID) NOT DETECTED NOT DETECTED Final   Staphylococcus epidermidis NOT DETECTED NOT DETECTED Final   Staphylococcus lugdunensis NOT DETECTED NOT DETECTED Final   Streptococcus species DETECTED (A) NOT DETECTED Final    Comment: CRITICAL RESULT CALLED TO, READ BACK BY AND VERIFIED WITH: PHARMD ELLEN JACKSON 02/06/22@23 :30 BY TW    Streptococcus agalactiae NOT DETECTED NOT DETECTED Final   Streptococcus pneumoniae NOT DETECTED NOT DETECTED Final  Streptococcus pyogenes DETECTED (A) NOT DETECTED Final    Comment: CRITICAL RESULT CALLED TO, READ BACK BY AND VERIFIED WITH: PHARMD ELLEN JACKSON 02/06/22@23 :30 BY TW    A.calcoaceticus-baumannii NOT DETECTED NOT DETECTED Final   Bacteroides fragilis NOT DETECTED NOT DETECTED Final   Enterobacterales NOT DETECTED NOT DETECTED Final   Enterobacter cloacae complex NOT DETECTED NOT DETECTED Final   Escherichia coli NOT DETECTED NOT DETECTED Final   Klebsiella aerogenes NOT DETECTED NOT DETECTED Final   Klebsiella oxytoca NOT DETECTED NOT DETECTED Final   Klebsiella pneumoniae NOT DETECTED NOT DETECTED Final   Proteus species NOT DETECTED NOT DETECTED Final   Salmonella species NOT DETECTED NOT DETECTED Final   Serratia marcescens NOT DETECTED NOT DETECTED Final   Haemophilus influenzae NOT DETECTED NOT DETECTED Final   Neisseria meningitidis NOT DETECTED NOT DETECTED Final   Pseudomonas aeruginosa NOT DETECTED NOT DETECTED Final   Stenotrophomonas maltophilia NOT DETECTED NOT DETECTED Final   Candida albicans NOT DETECTED NOT DETECTED Final   Candida auris NOT DETECTED NOT DETECTED Final   Candida glabrata NOT DETECTED NOT DETECTED Final   Candida  krusei NOT DETECTED NOT DETECTED Final   Candida parapsilosis NOT DETECTED NOT DETECTED Final   Candida tropicalis NOT DETECTED NOT DETECTED Final   Cryptococcus neoformans/gattii NOT DETECTED NOT DETECTED Final    Comment: Performed at [facility], [address], [place], [place] [address]  Resp Panel by RT-PCR (Flu A&B, Covid) Nasopharyngeal Swab     Status: None   Collection Time: 02/05/22 11:08 PM   Specimen: Nasopharyngeal Swab; Nasopharyngeal(NP) swabs in vial transport medium  Result Value Ref Range Status   SARS Coronavirus 2 by RT PCR NEGATIVE NEGATIVE Final    Comment: (NOTE) SARS-CoV-2 target nucleic acids are NOT DETECTED.  The SARS-CoV-2 RNA is generally detectable in upper respiratory specimens during the acute phase of infection. The lowest concentration of SARS-CoV-2 viral copies this assay can detect is 138 copies/mL. A negative result does not preclude SARS-Cov-2 infection and should not be used as the sole basis for treatment or other patient management decisions. A negative result may occur with  improper specimen collection/handling, submission of specimen other than nasopharyngeal swab, presence of viral mutation(s) within the areas targeted by this assay, and inadequate number of viral copies(<138 copies/mL). A negative result must be combined with clinical observations, patient history, and epidemiological information. The expected result is Negative.  Fact Sheet for Patients:  EntrepreneurPulse.com.au  Fact Sheet for Healthcare Providers:  IncredibleEmployment.be  This test is no t yet approved or cleared by the [place] FDA and  has been authorized for detection and/or diagnosis of SARS-CoV-2 by FDA under an Emergency Use Authorization (EUA). This EUA will remain  in effect (meaning this test can be used) for the duration of the COVID-19 declaration under Section 564(b)(1) of the Act, 21 U.S.C.section  360bbb-3(b)(1), unless the authorization is terminated  or revoked sooner.       Influenza A by PCR NEGATIVE NEGATIVE Final   Influenza B by PCR NEGATIVE NEGATIVE Final    Comment: (NOTE) The Xpert Xpress SARS-CoV-2/FLU/RSV plus assay is intended as an aid in the diagnosis of influenza from Nasopharyngeal swab specimens and should not be used as a sole basis for treatment. Nasal washings and aspirates are unacceptable for Xpert Xpress SARS-CoV-2/FLU/RSV testing.  Fact Sheet for Patients: EntrepreneurPulse.com.au  Fact Sheet for Healthcare Providers: IncredibleEmployment.be  This test is not yet approved or cleared by the [place] and  has been authorized for detection and/or diagnosis of SARS-CoV-2 by FDA under an Emergency Use Authorization (EUA). This EUA will remain in effect (meaning this test can be used) for the duration of the COVID-19 declaration under Section 564(b)(1) of the Act, 21 U.S.C. section 360bbb-3(b)(1), unless the authorization is terminated or revoked.  Performed at [facility], [address] [place], [place] [address]   Culture, blood (Routine x 2)     Status: Abnormal   Collection Time: 02/05/22 11:20 PM   Specimen: BLOOD  Result Value Ref Range Status   Specimen Description   Final    BLOOD Blood Culture adequate volume Performed at [facility], [address], [place], [place] [address]    Special Requests   Final    BLOOD LEFT HAND BOTTLES DRAWN AEROBIC AND ANAEROBIC   Culture  Setup Time   Final    GRAM POSITIVE COCCI IN CHAINS IN PAIRS IN BOTH AEROBIC AND ANAEROBIC BOTTLES CRITICAL VALUE NOTED.  VALUE IS CONSISTENT WITH PREVIOUSLY REPORTED AND CALLED VALUE.    Culture (A)  Final    STREPTOCOCCUS PYOGENES HEALTH DEPARTMENT NOTIFIED Performed at [facility], [place] [address], [place], [place] [address]    Report Status 02/09/2022 FINAL  Final    Organism ID, Bacteria STREPTOCOCCUS PYOGENES  Final      Susceptibility   Streptococcus pyogenes - MIC*    PENICILLIN <=0.06 SENSITIVE Sensitive     CEFTRIAXONE <=0.12 SENSITIVE Sensitive     ERYTHROMYCIN <=0.12 SENSITIVE Sensitive     LEVOFLOXACIN 0.5 SENSITIVE Sensitive     VANCOMYCIN <=0.12 SENSITIVE Sensitive     * STREPTOCOCCUS PYOGENES  Blood Culture ID Panel (Reflexed)     Status: Abnormal   Collection Time: 02/05/22 11:20 PM  Result Value Ref Range Status   Enterococcus faecalis NOT DETECTED NOT DETECTED Final   Enterococcus Faecium NOT DETECTED NOT DETECTED Final   Listeria monocytogenes NOT DETECTED NOT DETECTED Final   Staphylococcus species NOT DETECTED NOT DETECTED Final   Staphylococcus aureus (BCID) NOT DETECTED NOT DETECTED Final   Staphylococcus epidermidis NOT DETECTED NOT DETECTED Final   Staphylococcus lugdunensis NOT DETECTED NOT DETECTED Final   Streptococcus species DETECTED (A) NOT DETECTED Final    Comment: CRITICAL RESULT CALLED TO, READ BACK BY AND VERIFIED WITH: D WOFFORD PHARMD @2004  02/06/22 EB    Streptococcus agalactiae NOT DETECTED NOT DETECTED Final   Streptococcus pneumoniae NOT DETECTED NOT DETECTED Final   Streptococcus pyogenes DETECTED (A) NOT DETECTED Final    Comment: CRITICAL RESULT CALLED TO, READ BACK BY AND VERIFIED WITH: D WOFFORD PHARMD @2004  02/06/22 EB    A.calcoaceticus-baumannii NOT DETECTED NOT DETECTED Final   Bacteroides fragilis NOT DETECTED NOT DETECTED Final   Enterobacterales NOT DETECTED NOT DETECTED Final   Enterobacter cloacae complex NOT DETECTED NOT DETECTED Final   Escherichia coli NOT DETECTED NOT DETECTED Final   Klebsiella aerogenes NOT DETECTED NOT DETECTED Final   Klebsiella oxytoca NOT DETECTED NOT DETECTED Final   Klebsiella pneumoniae NOT DETECTED NOT DETECTED Final   Proteus species NOT DETECTED NOT DETECTED Final   Salmonella species NOT DETECTED NOT DETECTED Final   Serratia marcescens NOT DETECTED NOT  DETECTED Final   Haemophilus influenzae NOT DETECTED NOT DETECTED Final   Neisseria meningitidis NOT DETECTED NOT DETECTED Final   Pseudomonas aeruginosa NOT DETECTED NOT DETECTED Final   Stenotrophomonas maltophilia NOT DETECTED NOT DETECTED Final   Candida albicans NOT DETECTED NOT DETECTED Final   Candida auris  NOT DETECTED NOT DETECTED Final   Candida glabrata NOT DETECTED NOT DETECTED Final   Candida krusei NOT DETECTED NOT DETECTED Final   Candida parapsilosis NOT DETECTED NOT DETECTED Final   Candida tropicalis NOT DETECTED NOT DETECTED Final   Cryptococcus neoformans/gattii NOT DETECTED NOT DETECTED Final    Comment: Performed at [facility], [address] [place], [place] [address]  Culture, blood (routine x 2)     Status: None (Preliminary result)   Collection Time: 02/07/22 12:27 PM   Specimen: BLOOD  Result Value Ref Range Status   Specimen Description   Final    BLOOD RIGHT ANTECUBITAL Performed at [facility], [address] [place], [place] [address]    Special Requests   Final    BOTTLES DRAWN AEROBIC ONLY Blood Culture results may not be optimal due to an inadequate volume of blood received in culture bottles Performed at [facility], [address], [place], [place] [address]    Culture   Final    NO GROWTH 2 DAYS Performed at [facility], [address] [place], [place] [address]    Report Status PENDING  Incomplete     Discharge Instructions:   Discharge Instructions     Diet general   Complete by: As directed    Increase activity slowly   Complete by: As directed       Allergies as of 02/09/2022   No Known Allergies      Medication List     STOP taking these medications    amoxicillin 500 MG capsule Commonly known as: AMOXIL Replaced by: amoxicillin 500 MG tablet   aspirin EC 81 MG tablet   dicyclomine 20 MG tablet Commonly known as: BENTYL   fluticasone 50 MCG/ACT nasal  spray Commonly known as: FLONASE       TAKE these medications    amoxicillin 500 MG tablet Commonly known as: AMOXIL Take 1 tablet (500 mg total) by mouth in the morning, at noon, and at bedtime for 7 days. Replaces: amoxicillin 500 MG capsule        Follow-up Information     Comer, Belia Heman, MD. Go on 02/27/2022.   Specialty: Infectious Diseases Why: at 2:30 PM Contact information: [address] [place] [address] [phone]                   If you experience worsening of your admission symptoms, develop shortness of breath, life threatening emergency, suicidal or homicidal thoughts you must seek medical attention immediately by calling 911 or calling your MD immediately  if symptoms less severe.   You must read complete instructions/literature along with all the possible adverse reactions/side effects for all the medicines you take and that have been prescribed to you. Take any new medicines after you have completely understood and accept all the possible adverse reactions/side effects.    Please note   You were cared for by a hospitalist during your hospital stay. If you have any questions about your discharge medications or the care you received while you were in the hospital after you are discharged, you can call the unit and asked to speak with the hospitalist on call if the hospitalist that took care of you is not available. Once you are discharged, your primary care physician will handle any further medical issues. Please note that NO REFILLS for any discharge medications will be authorized once you are discharged, as it is  imperative that you return to your primary care physician (or establish a relationship with a primary care physician if you do not have one) for your aftercare needs so that they can reassess your need for medications and monitor your lab values.       Time coordinating discharge: Greater than 30 minutes  Signed:  Joyleen Haselton  [facility] 02/09/2022, 12:03 PM   Pager on [email or web address]. If 7PM-7AM, please contact night-coverage at [email or web address]

## 2022-02-10 ENCOUNTER — Other Ambulatory Visit (HOSPITAL_COMMUNITY): Payer: Self-pay

## 2022-02-12 LAB — CULTURE, BLOOD (ROUTINE X 2): Culture: NO GROWTH

## 2022-02-13 SURGERY — ECHOCARDIOGRAM, TRANSESOPHAGEAL
Anesthesia: Monitor Anesthesia Care

## 2022-02-26 ENCOUNTER — Other Ambulatory Visit (HOSPITAL_COMMUNITY): Payer: Self-pay

## 2022-02-27 ENCOUNTER — Inpatient Hospital Stay: Payer: BLUE CROSS/BLUE SHIELD | Admitting: Internal Medicine

## 2023-02-11 IMAGING — DX DG CHEST 2V
2 series · 2 of 2 positions shown · non-contrast
Comparison: Report 03/25/2014

CLINICAL DATA: Suspected sepsis

EXAM:
CHEST - 2 VIEW

[chest pa]
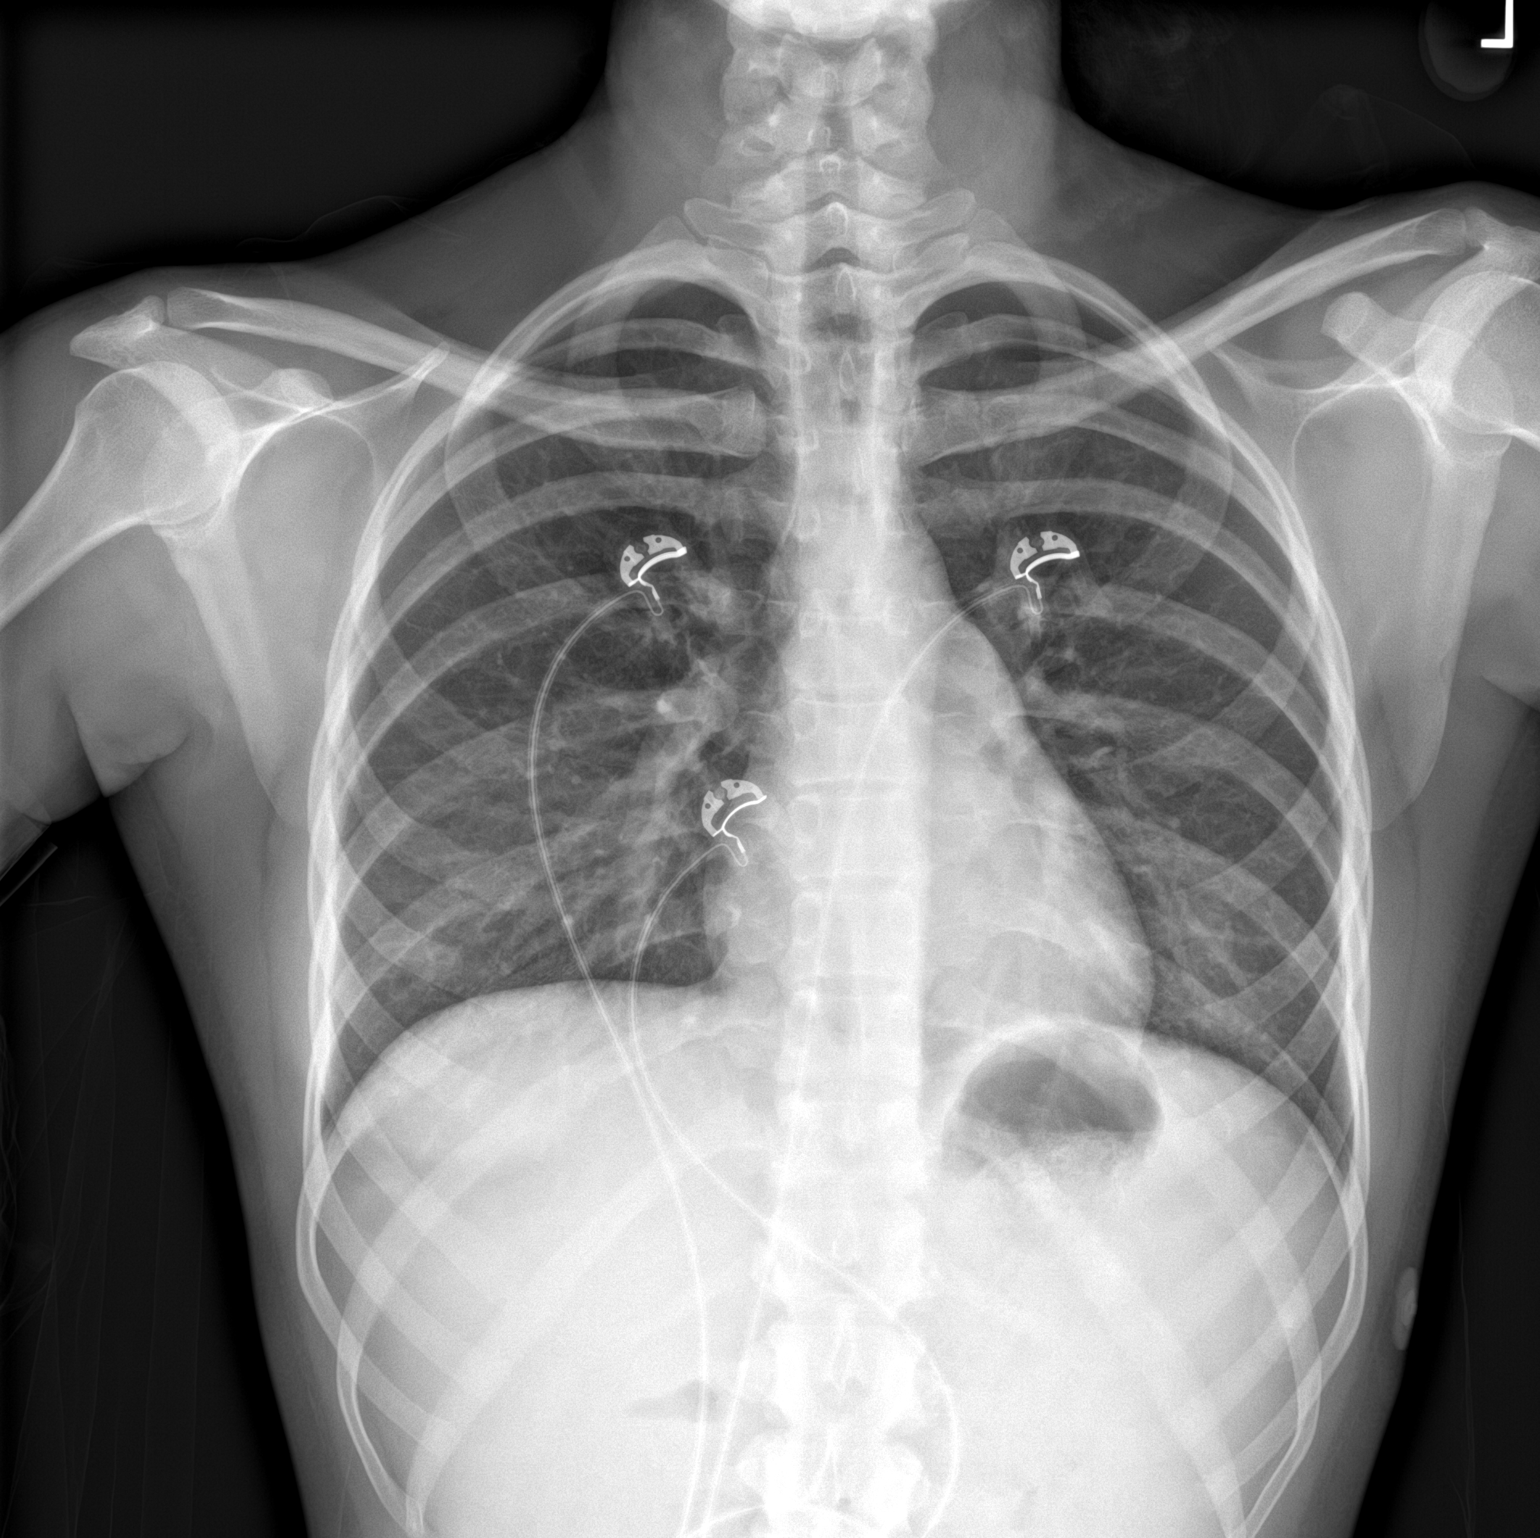

[chest lat]
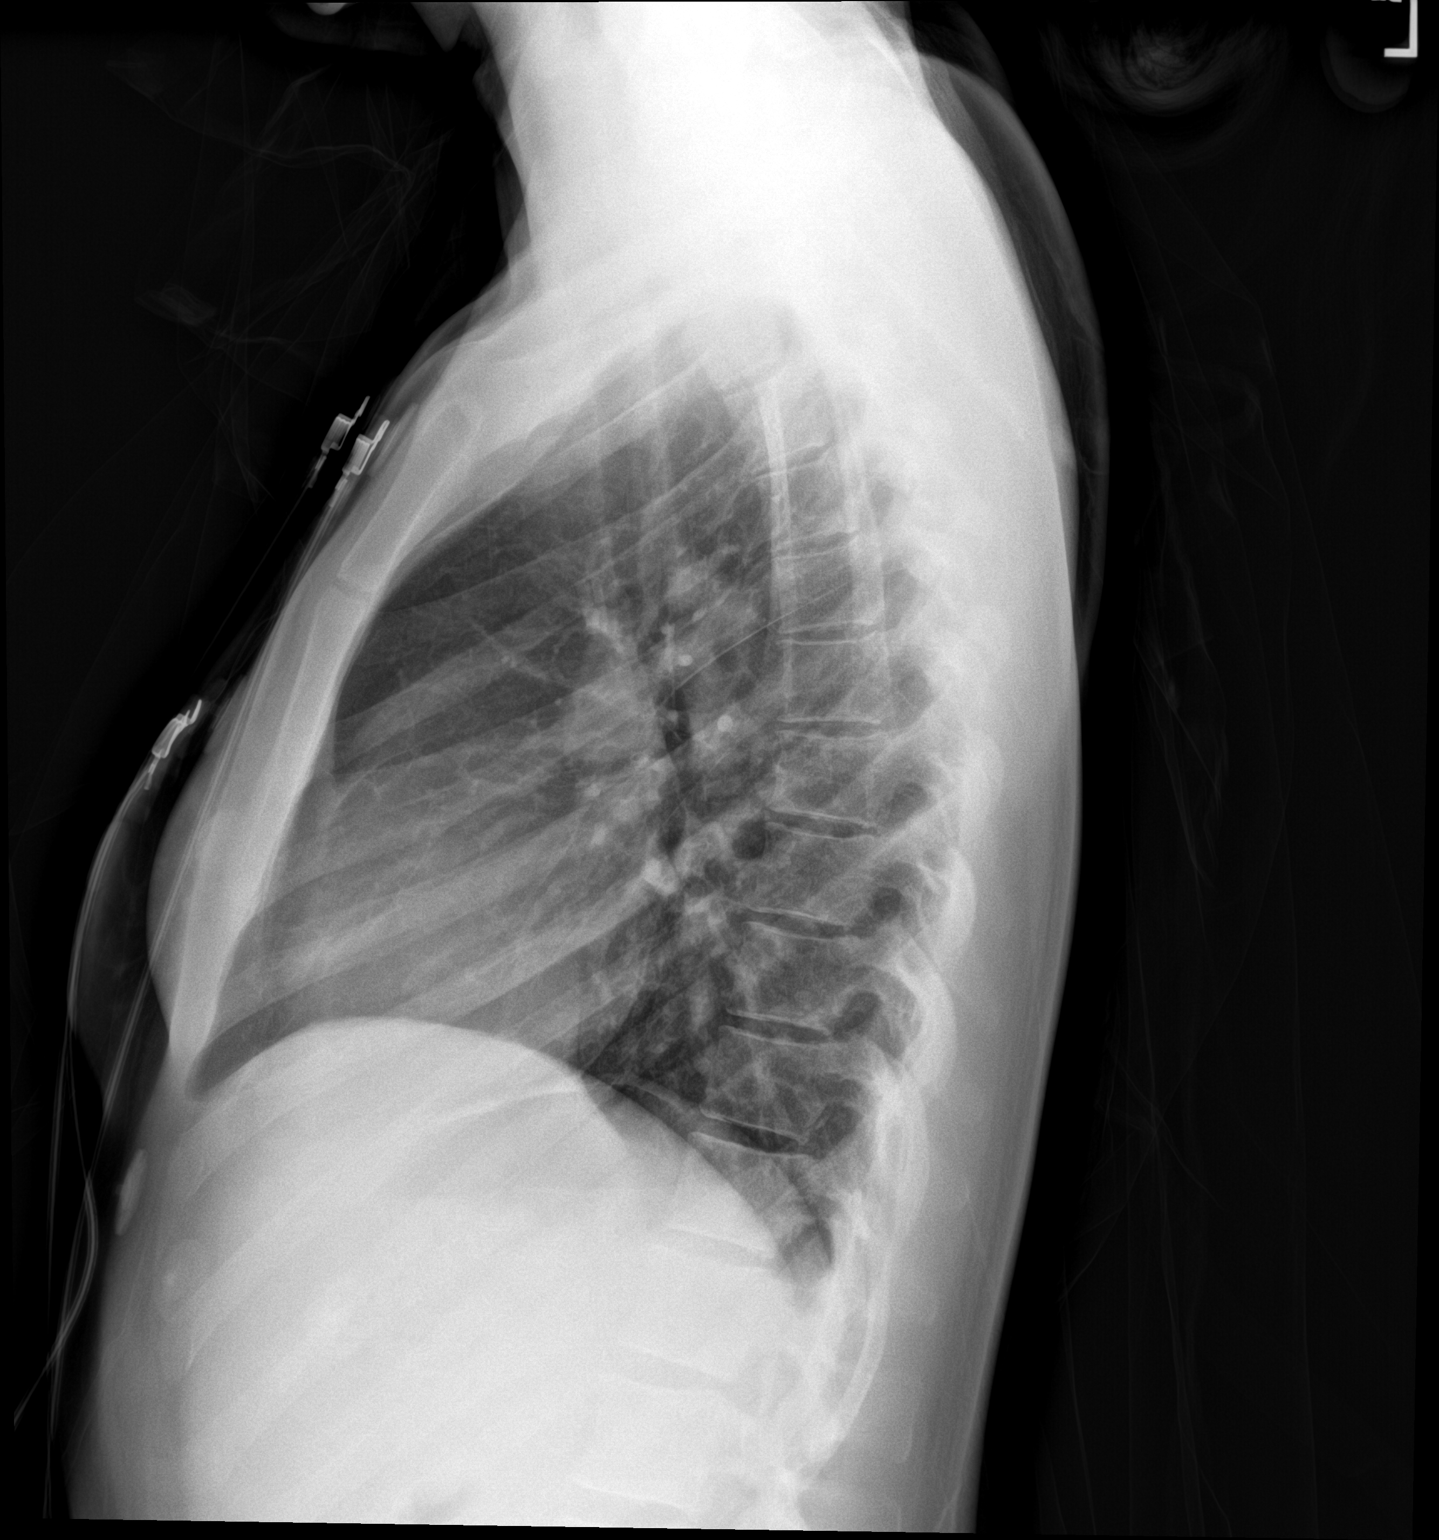

[2 of 2 positions shown; findings below may reference images not displayed]

FINDINGS: The heart size and mediastinal contours are within normal limits.
Both lungs are clear. The visualized skeletal structures are
unremarkable.
IMPRESSION: No active cardiopulmonary disease.

## 2023-02-11 IMAGING — DX DG FOREARM 2V*R*
2 series · 2 of 2 positions shown · non-contrast
Comparison: None.

CLINICAL DATA: Infection red and swollen

EXAM:
RIGHT FOREARM - 2 VIEW

[forearm ap]
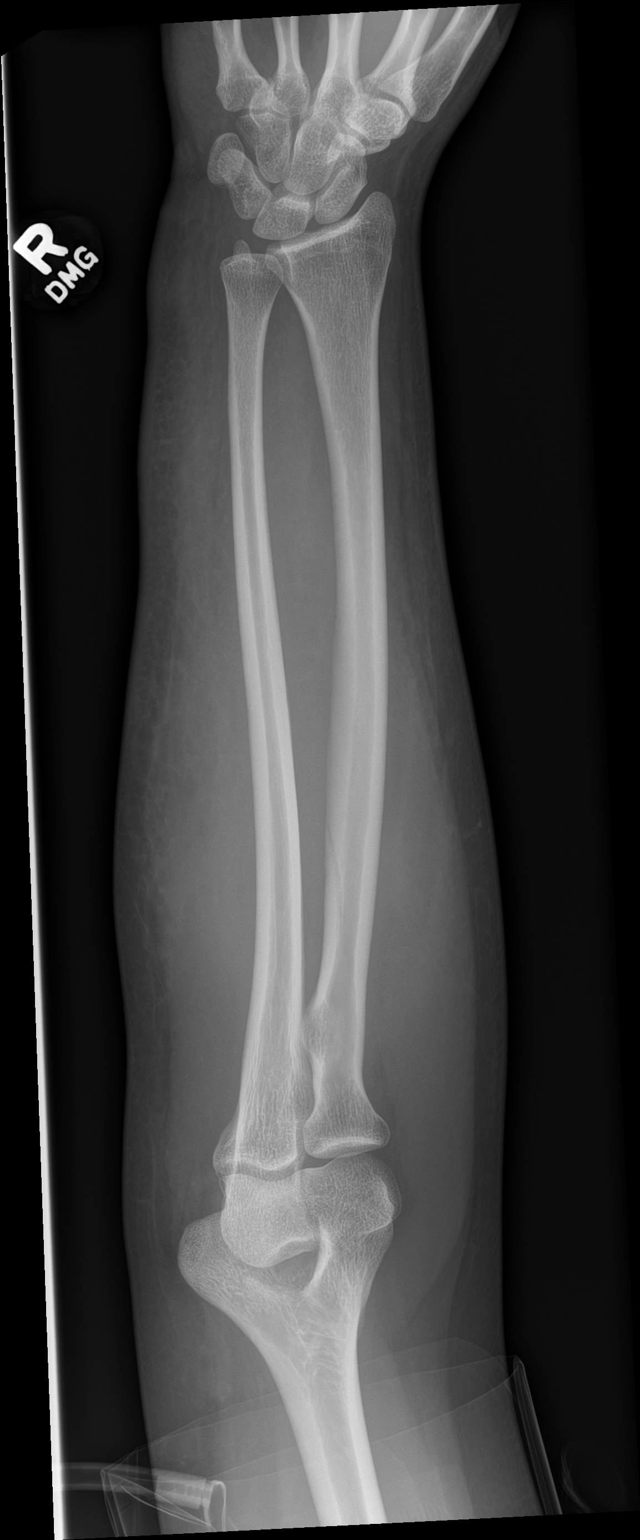

[forearm lat]
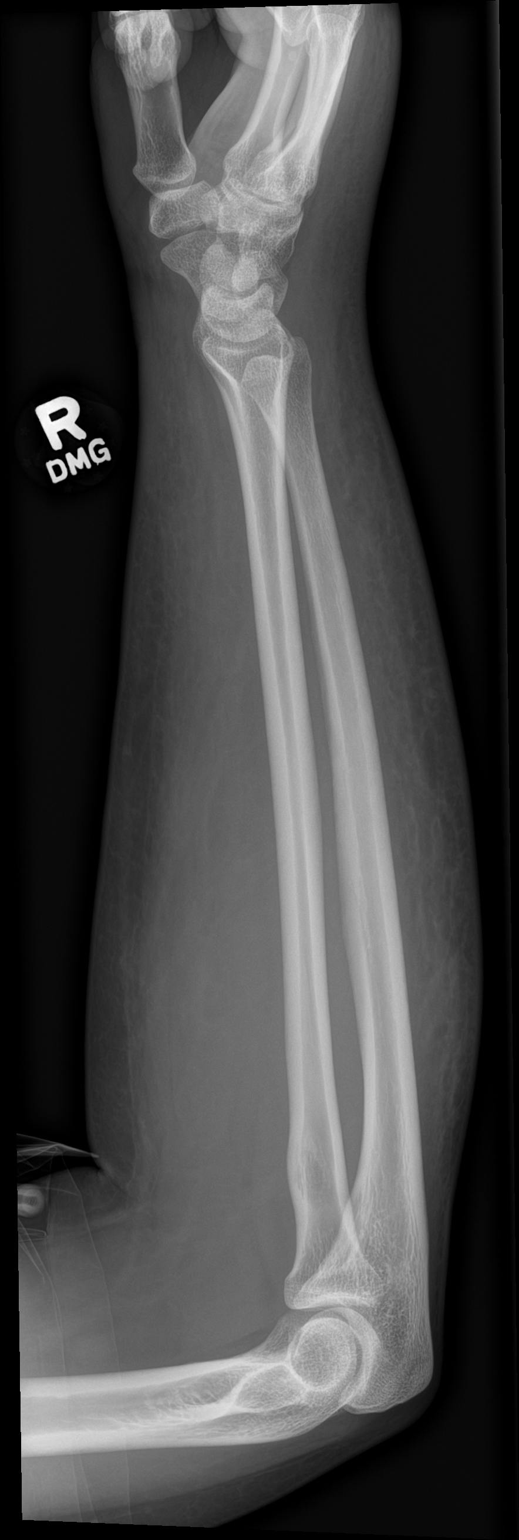

[2 of 2 positions shown; findings below may reference images not displayed]

FINDINGS: No fracture or malalignment. No periostitis or frank osseous
destructive change. No radiopaque foreign body. No significant elbow
effusion. Generalized edema within the soft tissues of the forearm.
No soft tissue gas
IMPRESSION: Soft tissue edema.  No acute osseous abnormality.

## 2024-01-13 DIAGNOSIS — F1120 Opioid dependence, uncomplicated: Secondary | ICD-10-CM | POA: Diagnosis not present

## 2024-01-20 DIAGNOSIS — F1120 Opioid dependence, uncomplicated: Secondary | ICD-10-CM | POA: Diagnosis not present

## 2024-01-27 DIAGNOSIS — F1120 Opioid dependence, uncomplicated: Secondary | ICD-10-CM | POA: Diagnosis not present

## 2024-02-03 DIAGNOSIS — F1120 Opioid dependence, uncomplicated: Secondary | ICD-10-CM | POA: Diagnosis not present

## 2024-02-10 DIAGNOSIS — F1120 Opioid dependence, uncomplicated: Secondary | ICD-10-CM | POA: Diagnosis not present

## 2024-02-17 DIAGNOSIS — F1120 Opioid dependence, uncomplicated: Secondary | ICD-10-CM | POA: Diagnosis not present

## 2024-02-24 DIAGNOSIS — F1120 Opioid dependence, uncomplicated: Secondary | ICD-10-CM | POA: Diagnosis not present

## 2024-03-01 ENCOUNTER — Telehealth: Payer: Self-pay

## 2024-03-01 DIAGNOSIS — I10 Essential (primary) hypertension: Secondary | ICD-10-CM

## 2024-03-01 NOTE — Patient Outreach (Signed)
 Submitted referral for CCM.  [facility]  Population Health Care Management Assistant  Direct Dial: [phone]  Fax: [phone] Website: [email or web address]

## 2024-03-02 ENCOUNTER — Telehealth: Payer: Self-pay

## 2024-03-02 NOTE — Progress Notes (Signed)
..   Medicaid Managed Care   Unsuccessful Outreach Note  03/02/2024 Name: Belinda Wilson MRN: [id number] DOB: 1996-03-29  Referred by: Patient, No Pcp Per Reason for referral : High Risk Managed Medicaid (Initial call made to the patient today to get her scheduled with the MM RNCM. She did not answer and there was not a VM.)   An unsuccessful telephone outreach was attempted today. The patient was referred to the case management team for assistance with care management and care coordination.   Follow Up Plan: The care management team will reach out to the patient again over the next 7 days.   [facility], [facility] Guide Direct Dial: [phone]  Fax: [phone]

## 2024-03-06 ENCOUNTER — Telehealth: Payer: Self-pay

## 2024-03-06 NOTE — Progress Notes (Signed)
..   Medicaid Managed Care   Unsuccessful Outreach Note  03/06/2024 Name: Belinda Wilson MRN: [id number] DOB: [birth year]  Referred by: Patient, No Pcp Per Reason for referral : High Risk Managed Medicaid (Second attempt made to reach the patient. Her number is not in order.)   A second unsuccessful telephone outreach was attempted today. The patient was referred to the case management team for assistance with care management and care coordination.   Follow Up Plan: The care management team will reach out to the patient again over the next 7 days.   [facility], [facility] Guide Direct Dial: [phone]  Fax: [phone]

## 2025-01-13 ENCOUNTER — Ambulatory Visit
Admission: EM | Admit: 2025-01-13 | Discharge: 2025-01-13 | Disposition: A | Discharge status: Home / Self Care | Payer: MEDICAID

## 2025-01-13 ENCOUNTER — Encounter: Payer: Self-pay | Admitting: Emergency Medicine

## 2025-01-13 DIAGNOSIS — R102 Pelvic and perineal pain unspecified side: Secondary | ICD-10-CM

## 2025-01-13 NOTE — ED Triage Notes (Signed)
 Patient states that she put a tampon in 2 days ago and had sex and is unable to find the tampon.  Patient reports abdominal cramping.

## 2025-01-13 NOTE — Discharge Instructions (Signed)
-   I did not see a tampon foreign body in her vagina. - There is a little bit of menstrual blood coming from your cervix.  Your IUD strings are in place. - I did not see any discharge. - Some of your cramping is probably related to the menstrual bleeding.  I would advise Tylenol  and warm compresses. - If you have increased discomfort, foul-smelling vaginal discharge seek reevaluation.

## 2025-01-13 NOTE — ED Provider Notes (Signed)
 " [facility]    CSN: [id number] Arrival date & time: 01/13/25  1359      History   Chief Complaint Chief Complaint  Patient presents with   Foreign Body in Vagina    HPI Belinda Wilson is a 29 y.o. female presenting for suspected vaginal foreign body.  Patient reports she has an IUD and does not have her normal/regular menstrual periods.  She states she noticed a little bit of bleeding a couple days ago so she put a tampon in but has multiple strings.  She says she did not think she put it in very well and then she had sexual intercourse.  Now she is having a little bit more cramping. No vaginal discharge. Nothing taken for pain. No other concerns.   HPI  Past Medical History:  Diagnosis Date   Anxiety    Depression    IV drug abuse (HCC)    Tricuspid regurgitation     Patient Active Problem List   Diagnosis Date Noted   Streptococcal bacteremia 02/07/2022   IVDU (intravenous drug user)    Sepsis (HCC) 02/06/2022   Cellulitis 02/06/2022   IVDA (intravenous drug abuse) complicating pregnancy (HCC) 02/06/2022    History reviewed. No pertinent surgical history.  OB History   No obstetric history on file.      Home Medications    Prior to Admission medications  Medication Sig Start Date End Date Taking? Authorizing Provider  levonorgestrel (MIRENA, 52 MG,) 20 MCG/DAY IUD 1 each by Intrauterine route. 12/07/22  Yes [provider]    Family History Family History  Problem Relation Age of Onset   Hypertension Paternal Grandfather    Asthma Paternal Grandfather    Diabetes Maternal Grandmother     Social History Social History[1]   Allergies   Patient has no known allergies.   Review of Systems Review of Systems  Constitutional:  Negative for fatigue and fever.  Gastrointestinal:  Positive for abdominal pain.  Genitourinary:  Positive for pelvic pain. Negative for dysuria, flank pain, frequency, hematuria, urgency, vaginal  bleeding, vaginal discharge and vaginal pain.  Musculoskeletal:  Negative for back pain.  Skin:  Negative for rash.     Physical Exam Triage Vital Signs ED Triage Vitals  Encounter Vitals Group     BP 01/13/25 1426 (!) 143/97     Girls Systolic BP Percentile --      Girls Diastolic BP Percentile --      Boys Systolic BP Percentile --      Boys Diastolic BP Percentile --      Pulse Rate 01/13/25 1426 (!) 104     Resp 01/13/25 1426 14     Temp 01/13/25 1426 98.4 F (36.9 C)     Temp Source 01/13/25 1426 Oral     SpO2 01/13/25 1426 97 %     Weight 01/13/25 1425 139 lb 8.8 oz (63.3 kg)     Height 01/13/25 1425 5' 4 (1.626 m)     Head Circumference --      Peak Flow --      Pain Score 01/13/25 1424 5     Pain Loc --      Pain Education --      Exclude from Growth Chart --    No data found.  Updated Vital Signs BP (!) 143/97 (BP Location: Right Arm)   Pulse (!) 104   Temp 98.4 F (36.9 C) (Oral)   Resp 14   Ht 5' 4 (  1.626 m)   Wt 139 lb 8.8 oz (63.3 kg)   SpO2 97%   BMI 23.95 kg/m     Physical Exam Vitals and nursing note reviewed.  Constitutional:      General: She is not in acute distress.    Appearance: Normal appearance. She is not ill-appearing or toxic-appearing.  HENT:     Head: Normocephalic and atraumatic.  Eyes:     General: No scleral icterus.       Right eye: No discharge.        Left eye: No discharge.     Conjunctiva/sclera: Conjunctivae normal.  Cardiovascular:     Rate and Rhythm: Regular rhythm. Tachycardia present.     Heart sounds: Normal heart sounds.  Pulmonary:     Effort: Pulmonary effort is normal. No respiratory distress.     Breath sounds: Normal breath sounds.  Abdominal:     Palpations: Abdomen is soft.     Tenderness: There is abdominal tenderness (suprapubic).  Genitourinary:    General: Normal vulva.     Vagina: No vaginal discharge.     Comments: No foreign bodies noted. Able to see clear to cervix--minimal bleeding. IUD  strings intact Musculoskeletal:     Cervical back: Neck supple.  Skin:    General: Skin is dry.  Neurological:     General: No focal deficit present.     Mental Status: She is alert. Mental status is at baseline.     Motor: No weakness.     Gait: Gait normal.  Psychiatric:        Mood and Affect: Mood normal.        Behavior: Behavior normal.      UC Treatments / Results  Labs (all labs ordered are listed, but only abnormal results are displayed) Labs Reviewed - No data to display  EKG   Radiology No results found.  Procedures Procedures (including critical care time)  Medications Ordered in UC Medications - No data to display  Initial Impression / Assessment and Plan / UC Course  I have reviewed the triage vital signs and the nursing notes.  Pertinent labs & imaging results that were available during my care of the patient were reviewed by me and considered in my medical decision making (see chart for details).   29 year old female presents for pelvic cramping and suspected vaginal foreign body over the past couple days.  Patient reports having stopped after putting a tampon in 2 days ago.    Exam today reveals no foreign bodies.  Minimal blood from cervix with visible IUD strings.  Advised patient I did not detect any foreign bodies.    Supportive care encouraged at this time for the pelvic cramping.  Advised heating pad and Tylenol .  Advised to return or go to ED for worsening pain, vaginal discharge, etc.   Final Clinical Impressions(s) / UC Diagnoses   Final diagnoses:  Pelvic cramping     Discharge Instructions      - I did not see a tampon foreign body in her vagina. - There is a little bit of menstrual blood coming from your cervix.  Your IUD strings are in place. - I did not see any discharge. - Some of your cramping is probably related to the menstrual bleeding.  I would advise Tylenol  and warm compresses. - If you have increased discomfort,  foul-smelling vaginal discharge seek reevaluation.     ED Prescriptions   None    PDMP not reviewed this encounter.     [  1]  Social History Tobacco Use   Smoking status: Never   Smokeless tobacco: Never  Vaping Use   Vaping status: Never Used  Substance Use Topics   Alcohol use: Yes   Drug use: Yes    Types: Marijuana, IV    Comment: daily use of heroin and meth     Arvis Jolan NOVAK, PA-C 01/13/25 1454  "
